# Patient Record
Sex: Male | Born: 1985
Health system: Southern US, Community
[De-identification: ages and names within clinical notes are randomized; demographics above are authoritative.]

## PROBLEM LIST (undated history)

## (undated) DIAGNOSIS — R51 Headache: Secondary | ICD-10-CM

## (undated) DIAGNOSIS — M8700 Idiopathic aseptic necrosis of unspecified bone: Secondary | ICD-10-CM

## (undated) DIAGNOSIS — R519 Headache, unspecified: Secondary | ICD-10-CM

## (undated) HISTORY — PX: TYMPANOSTOMY TUBE PLACEMENT: SHX32

## (undated) HISTORY — PX: WISDOM TOOTH EXTRACTION: SHX21

---

## 2015-06-05 ENCOUNTER — Ambulatory Visit (HOSPITAL_BASED_OUTPATIENT_CLINIC_OR_DEPARTMENT_OTHER)
Admission: RE | Admit: 2015-06-05 | Discharge: 2015-06-05 | Disposition: A | Discharge status: Home / Self Care | Payer: BLUE CROSS/BLUE SHIELD | Source: Ambulatory Visit | Attending: Medical | Admitting: Medical

## 2015-06-05 ENCOUNTER — Ambulatory Visit (INDEPENDENT_AMBULATORY_CARE_PROVIDER_SITE_OTHER): Payer: BLUE CROSS/BLUE SHIELD | Admitting: Medical

## 2015-06-05 ENCOUNTER — Encounter: Payer: Self-pay | Admitting: Medical

## 2015-06-05 VITALS — BP 126/80 | HR 68 | Temp 98.1°F | Ht 72.0 in | Wt 213.4 lb

## 2015-06-05 DIAGNOSIS — R059 Cough, unspecified: Secondary | ICD-10-CM

## 2015-06-05 DIAGNOSIS — R05 Cough: Secondary | ICD-10-CM | POA: Diagnosis not present

## 2015-06-05 DIAGNOSIS — F17200 Nicotine dependence, unspecified, uncomplicated: Secondary | ICD-10-CM | POA: Insufficient documentation

## 2015-06-05 DIAGNOSIS — M549 Dorsalgia, unspecified: Secondary | ICD-10-CM | POA: Diagnosis not present

## 2015-06-05 DIAGNOSIS — Z720 Tobacco use: Secondary | ICD-10-CM

## 2015-06-05 DIAGNOSIS — Z87891 Personal history of nicotine dependence: Secondary | ICD-10-CM

## 2015-06-05 DIAGNOSIS — L089 Local infection of the skin and subcutaneous tissue, unspecified: Secondary | ICD-10-CM

## 2015-06-05 MED ORDER — DOXYCYCLINE HYCLATE 100 MG PO TABS: 100.0000 mg | ORAL_TABLET | Freq: Two times a day (BID) | ORAL | Status: DC

## 2015-06-05 NOTE — Progress Notes (Signed)
   Subjective:    Patient ID: Devin Romero, male    DOB: 1986-01-17, 30 y.o.   MRN: [id number]  HPI  I have reviewed pt PMH, PSH, FH, Social History and Surgical History  Pt works Advertising account planner, No exercise,  2-3 cups coffee a day, pt eats healthy, married- 30 years old.  Pt states he has some mild annoyance in his back. He points to his left upper back region/thoracic for 4 months. No injury. Pain not worse with movement. No known insect bites. No fever or chills.  Pt is a smoker. He plans to stop but want to try to quit on his own. If fails then willing to try medications.     Review of Systems  Constitutional: Negative for fever, chills and fatigue.  HENT: Negative for congestion, ear pain, mouth sores, postnasal drip, rhinorrhea and sinus pressure.   Respiratory: Positive for cough. Negative for shortness of breath and wheezing.        Rare occasional mild cough  Cardiovascular: Negative for chest pain and palpitations.  Gastrointestinal: Negative for abdominal pain.  Musculoskeletal: Positive for back pain.  Skin: Positive for rash.       See hpi and physical exam  Neurological: Negative for dizziness, seizures, facial asymmetry and weakness.  Hematological: Negative for adenopathy. Does not bruise/bleed easily.  Psychiatric/Behavioral: Negative for behavioral problems and confusion.    History reviewed. No pertinent past medical history.  Social History   Social History  . Marital Status: Married    Spouse Name: N/A  . Number of Children: N/A  . Years of Education: N/A   Occupational History  . Not on file.   Social History Main Topics  . Smoking status: Current Every Day Smoker  . Smokeless tobacco: Never Used  . Alcohol Use: No     Comment: 1-2 beers occasionally.   . Drug Use: No  . Sexual Activity: Not on file   Other Topics Concern  . Not on file   Social History Narrative  . No narrative on file    History reviewed. No pertinent past surgical  history.  Family History  Problem Relation Age of Onset  . Hyperlipidemia Mother   . Hypertension Maternal Grandmother     No Known Allergies  No current outpatient prescriptions on file prior to visit.   No current facility-administered medications on file prior to visit.    BP 126/80 mmHg  Pulse 68  Temp(Src) 98.1 F (36.7 C) (Oral)  Ht 6' (1.829 m)  Wt 213 lb 6.4 oz (96.798 kg)  BMI 28.94 kg/m2  SpO2 98%       Objective:   Physical Exam   General- No acute distress. Pleasant patient. Neck- Full range of motion, no jvd Lungs- Clear, even and unlabored. Heart- regular rate and rhythm. Neurologic- CNII- XII grossly intact.  Back- left side of back. Upper thorax. Adjacent to spine. Mild pink/red area about 2.5 cm x 2.0cm. Faint warm. Faint tender. No vesicles.      Assessment & Plan:  By physical exam this rash likely represents skin infection. Will prescribe doxycycline antibiotic.  This has been present for 4 weeks and want to follow closely to assess response to antibiotic. If you notice any blister type outbreak then let us know and would rx antiviral.  Pt has history of smoking and  cough occasionally will get xray of chest.   Follow up 10-14 days or as needed.  You could schedule CPE also as well.

## 2015-06-05 NOTE — Patient Instructions (Addendum)
By physical exam this rash likely represents skin infection. Will prescribe doxycycline antibiotic.  This has been present for 4 weeks and want to follow closely to assess response to antibiotic. If you notice any blister type outbreak then let us know and would rx antiviral.  Pt has history of smoking and  cough occasionally will get xray of chest.   Follow up 10-14 days or as needed.  You could schedule CPE also as well.

## 2015-06-05 NOTE — Progress Notes (Signed)
Pre visit review using our clinic review tool, if applicable. No additional management support is needed unless otherwise documented below in the visit note. 

## 2015-08-21 ENCOUNTER — Emergency Department (HOSPITAL_BASED_OUTPATIENT_CLINIC_OR_DEPARTMENT_OTHER)
Admission: EM | Admit: 2015-08-21 | Discharge: 2015-08-21 | Disposition: A | Discharge status: Home / Self Care | Payer: BLUE CROSS/BLUE SHIELD | Attending: Emergency Medicine | Admitting: Emergency Medicine

## 2015-08-21 ENCOUNTER — Emergency Department (HOSPITAL_BASED_OUTPATIENT_CLINIC_OR_DEPARTMENT_OTHER): Payer: BLUE CROSS/BLUE SHIELD

## 2015-08-21 ENCOUNTER — Encounter (HOSPITAL_BASED_OUTPATIENT_CLINIC_OR_DEPARTMENT_OTHER): Payer: Self-pay | Admitting: Emergency Medicine

## 2015-08-21 DIAGNOSIS — Y939 Activity, unspecified: Secondary | ICD-10-CM | POA: Diagnosis not present

## 2015-08-21 DIAGNOSIS — Y999 Unspecified external cause status: Secondary | ICD-10-CM | POA: Insufficient documentation

## 2015-08-21 DIAGNOSIS — Y9289 Other specified places as the place of occurrence of the external cause: Secondary | ICD-10-CM | POA: Insufficient documentation

## 2015-08-21 DIAGNOSIS — F17200 Nicotine dependence, unspecified, uncomplicated: Secondary | ICD-10-CM | POA: Diagnosis not present

## 2015-08-21 DIAGNOSIS — S8992XA Unspecified injury of left lower leg, initial encounter: Secondary | ICD-10-CM | POA: Diagnosis present

## 2015-08-21 DIAGNOSIS — S8002XA Contusion of left knee, initial encounter: Secondary | ICD-10-CM | POA: Insufficient documentation

## 2015-08-21 MED ORDER — NAPROXEN 500 MG PO TABS: 500.0000 mg | ORAL_TABLET | Freq: Two times a day (BID) | ORAL | Status: DC

## 2015-08-21 MED ORDER — KETOROLAC TROMETHAMINE 30 MG/ML IJ SOLN
30.0000 mg | Freq: Once | INTRAMUSCULAR | Status: AC
  Administered 2015-08-21: 30 mg via INTRAMUSCULAR
  Filled 2015-08-21: qty 1

## 2015-08-21 NOTE — ED Notes (Signed)
Crutch training completed, left knee sleeve applied
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
## 2015-08-21 NOTE — ED Notes (Signed)
Ice packs applied to lateral and medial aspect of knee
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
## 2015-08-21 NOTE — Discharge Instructions (Signed)
Your knee x-ray showed a little bit of swelling but no fracture or dislocation. Your chest x-ray was normal. We gave you a knee sleeve and crutches to help with your symptoms. Take naproxen as needed for pain. Keep your leg elevated and ice on and off for the next 48 hours to help with pain/swelling. Call Dr. Lazaro ArmsHudnall's office to schedule orthopedic follow up if your symptoms persist. Return to the ER for new or worsening symptoms.

## 2016-09-16 ENCOUNTER — Encounter: Payer: Self-pay | Admitting: Medical

## 2016-09-16 ENCOUNTER — Telehealth: Payer: Self-pay | Admitting: Medical

## 2016-09-16 ENCOUNTER — Ambulatory Visit (INDEPENDENT_AMBULATORY_CARE_PROVIDER_SITE_OTHER): Payer: BLUE CROSS/BLUE SHIELD | Admitting: Medical

## 2016-09-16 ENCOUNTER — Ambulatory Visit (HOSPITAL_BASED_OUTPATIENT_CLINIC_OR_DEPARTMENT_OTHER)
Admission: RE | Admit: 2016-09-16 | Discharge: 2016-09-16 | Disposition: A | Discharge status: Home / Self Care | Payer: BLUE CROSS/BLUE SHIELD | Source: Ambulatory Visit | Attending: Medical | Admitting: Medical

## 2016-09-16 VITALS — BP 120/58 | HR 77 | Temp 98.5°F | Ht 72.0 in | Wt 218.4 lb

## 2016-09-16 DIAGNOSIS — I96 Gangrene, not elsewhere classified: Secondary | ICD-10-CM | POA: Insufficient documentation

## 2016-09-16 DIAGNOSIS — M25551 Pain in right hip: Secondary | ICD-10-CM

## 2016-09-16 DIAGNOSIS — M87351 Other secondary osteonecrosis, right femur: Secondary | ICD-10-CM | POA: Diagnosis not present

## 2016-09-16 DIAGNOSIS — R1031 Right lower quadrant pain: Secondary | ICD-10-CM | POA: Diagnosis not present

## 2016-09-16 DIAGNOSIS — T7840XA Allergy, unspecified, initial encounter: Secondary | ICD-10-CM | POA: Diagnosis not present

## 2016-09-16 DIAGNOSIS — M87051 Idiopathic aseptic necrosis of right femur: Secondary | ICD-10-CM

## 2016-09-16 MED ORDER — PREDNISONE 10 MG PO TABS: ORAL_TABLET | ORAL | 0 refills | Status: DC

## 2016-09-16 MED ORDER — MELOXICAM 15 MG PO TABS: 15.0000 mg | ORAL_TABLET | Freq: Every day | ORAL | 0 refills | Status: DC

## 2016-09-16 MED ORDER — TRAMADOL HCL 50 MG PO TABS: 50.0000 mg | ORAL_TABLET | Freq: Three times a day (TID) | ORAL | 0 refills | Status: DC | PRN

## 2016-09-16 MED ORDER — HYDROXYZINE HCL 25 MG PO TABS: 25.0000 mg | ORAL_TABLET | Freq: Three times a day (TID) | ORAL | 0 refills | Status: DC | PRN

## 2016-09-16 NOTE — Telephone Encounter (Signed)
referal to orthopedist placed. Can you get him in next week.

## 2016-09-16 NOTE — Progress Notes (Signed)
Patient in for pain in Right upper thigh and rash on both left and right lower legs with itching..Marland Kitchen

## 2016-09-16 NOTE — Progress Notes (Signed)
Subjective:    Patient ID: Devin Romero, male    DOB: October 24, 1985, 31 y.o.   MRN: [id number]  HPI  Pt in with lower ext area that were itching with faint small area that itched in pretibial area. Rash for  2-3 weeks ago which came up after hiking. He scratched aggressivley and broke skin. Area still itch some. No yellow discharge from any broken skin area. Pt has not tied anything.  Pt also states some rt hip area pain with some pain in rt groin area. Pt was on vacation when felt pain. He did not do any sports but walked alot. He first noticed this early in am about 10 days ago.    Review of Systems  Constitutional: Negative for chills, fatigue and fever.  Respiratory: Negative for cough, chest tightness, shortness of breath and wheezing.   Cardiovascular: Negative for chest pain and palpitations.  Gastrointestinal: Negative for abdominal distention, anal bleeding, blood in stool, constipation, diarrhea, nausea and vomiting.  Musculoskeletal: Negative for back pain, joint swelling and myalgias.       Rt hip and groin region pain.  Skin: Negative for rash.       Lower ext- scattered small breakdown of skin. Appears  prior scattered and ruptured vesicles from itching.  Neurological: Negative for dizziness, weakness, numbness and headaches.  Hematological: Negative for adenopathy. Does not bruise/bleed easily.  Psychiatric/Behavioral: Negative for behavioral problems and confusion. The patient is not nervous/anxious.     No past medical history on file.   Social History   Social History  . Marital status: Married    Spouse name: N/A  . Number of children: N/A  . Years of education: N/A   Occupational History  . Not on file.   Social History Main Topics  . Smoking status: Current Every Day Smoker    Packs/day: 0.50  . Smokeless tobacco: Never Used  . Alcohol use 0.0 oz/week     Comment: 1-2 beers occasionally.   . Drug use: No  . Sexual activity: Yes   Other Topics  Concern  . Not on file   Social History Narrative  . No narrative on file    No past surgical history on file.  Family History  Problem Relation Age of Onset  . Hyperlipidemia Mother   . Hypertension Maternal Grandmother     No Known Allergies  Current Outpatient Prescriptions on File Prior to Visit  Medication Sig Dispense Refill  . naproxen (NAPROSYN) 500 MG tablet Take 1 tablet (500 mg total) by mouth 2 (two) times daily. (Patient not taking: Reported on 09/16/2016) 30 tablet 0   No current facility-administered medications on file prior to visit.     BP (!) 120/58   Pulse 77   Temp 98.5 F (36.9 C) (Oral)   Ht 6' (1.829 m)   Wt 218 lb 6.4 oz (99.1 kg)   SpO2 100%   BMI 29.62 kg/m       Objective:   Physical Exam  General- No acute distress. Pleasant patient. Neck- Full range of motion, no jvd Lungs- Clear, even and unlabored. Heart- regular rate and rhythm. Neurologic- CNII- XII grossly intact.  Rt hip- no pain on palpation directly but on range of motion pain. Leg roll causes pain in groin.  Genital- no hernia on exam of inguinal canal either side.  Abdomen- soft, non-tender, non-distended, +bs, no rebound or guarding. No rt lower quadrant pain. No heel jar pain.  Skin- lower ext- scattered small region  with appearance of possible prior  scattered broken vesicles that have been scratch/ruptured. 10-12 on each pretibial area. Also linear scratch lt pretibial area. No warmth or dc. No redness.         Assessment & Plan:  For your rt hip and groin area pain will get xray of your hip.  Will rx prednisone taper prescription for pain/infalmmation. Will give tx tramadol for moderate to severe pain. But low number of tramadol tabs.  If your pain persists past Wednesday of next week then refer to sports medicine.  For allergic reaction on lower ext secondary to likely exposure while hiking will rx hydroxyzine for itching. The taper prednisone should help  with allergic reaction as well.  Follow up 5-6 days or as needed  Eulis Salazar, Ramon DredgeEdward, VF CorporationPA-C

## 2016-09-16 NOTE — Patient Instructions (Addendum)
For your rt hip and groin area pain will get xray of your hip.  Will rx prednisone taper prescription for pain/inflammation. Will give tx tramadol for moderate to severe pain. But low number of tramadol tabs.  If your pain persists past Wednesday of next week then refer to sports medicine.  For allergic reaction on lower ext secondary to likely exposure while hiking will rx hydroxyzine for itching. The taper prednisone should help with allergic reaction as well.  Follow up 5-6 days or as needed

## 2016-09-17 ENCOUNTER — Telehealth (INDEPENDENT_AMBULATORY_CARE_PROVIDER_SITE_OTHER): Payer: Self-pay

## 2016-09-17 ENCOUNTER — Encounter: Payer: Self-pay | Admitting: Medical

## 2016-09-17 NOTE — Telephone Encounter (Signed)
[facility] [place] called about a stat referral from [facility] [employer], [place]. Pt was placed on hold  Then hung up per [place]. PW said if pt calls back he needs to ask PCP for an MRI. PW called Esperanza RichtersEdward Saguier 2 x then left a message with the front desk.

## 2016-09-17 NOTE — Telephone Encounter (Signed)
[facility], awaiting appt

## 2016-09-22 ENCOUNTER — Ambulatory Visit (INDEPENDENT_AMBULATORY_CARE_PROVIDER_SITE_OTHER): Payer: BLUE CROSS/BLUE SHIELD | Admitting: Physician Assistant

## 2016-09-22 DIAGNOSIS — M25551 Pain in right hip: Secondary | ICD-10-CM | POA: Diagnosis not present
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
## 2016-09-24 ENCOUNTER — Other Ambulatory Visit (INDEPENDENT_AMBULATORY_CARE_PROVIDER_SITE_OTHER): Payer: Self-pay

## 2016-09-24 DIAGNOSIS — M25551 Pain in right hip: Secondary | ICD-10-CM

## 2016-09-30 ENCOUNTER — Encounter (INDEPENDENT_AMBULATORY_CARE_PROVIDER_SITE_OTHER): Payer: Self-pay | Admitting: Physician Assistant

## 2016-10-10 ENCOUNTER — Ambulatory Visit
Admission: RE | Admit: 2016-10-10 | Discharge: 2016-10-10 | Disposition: A | Discharge status: Home / Self Care | Payer: BLUE CROSS/BLUE SHIELD | Source: Ambulatory Visit | Attending: Physician Assistant | Admitting: Physician Assistant

## 2016-10-10 DIAGNOSIS — M25551 Pain in right hip: Secondary | ICD-10-CM | POA: Diagnosis not present

## 2016-10-11 ENCOUNTER — Telehealth: Payer: Self-pay | Admitting: Medical

## 2016-10-13 MED ORDER — TRAMADOL HCL 50 MG PO TABS: 50.0000 mg | ORAL_TABLET | Freq: Three times a day (TID) | ORAL | 0 refills | Status: DC | PRN

## 2016-10-13 NOTE — Telephone Encounter (Signed)
Pt is requesting refill on tramadol 50mg . [name] Pt.   Last OV: 09/16/2016 Last Fill: 09/16/2016 #6 and 0RF  Please advise.

## 2016-10-13 NOTE — Telephone Encounter (Signed)
Devin Romero gave him 6 tramadol in May  NCCSR: - this is the only entry.  Will refill 10 pills

## 2016-10-15 ENCOUNTER — Telehealth: Payer: Self-pay | Admitting: Medical

## 2016-10-15 NOTE — Telephone Encounter (Signed)
Caller name: Devin Romero Relationship to patient: self Can be reached: [phone]  Reason for call: Pt is asking for [name] to review his recent [facility]. He would like [name]'s opinion. He would also like referral to another ortho for 2nd opinion. Please call pt when back in office.

## 2016-10-17 ENCOUNTER — Other Ambulatory Visit: Payer: Self-pay | Admitting: Family Medicine

## 2016-10-17 ENCOUNTER — Other Ambulatory Visit: Payer: Self-pay | Admitting: Medical

## 2016-10-18 ENCOUNTER — Telehealth: Payer: Self-pay | Admitting: Medical

## 2016-10-18 DIAGNOSIS — M6281 Muscle weakness (generalized): Secondary | ICD-10-CM | POA: Diagnosis not present

## 2016-10-18 DIAGNOSIS — M87051 Idiopathic aseptic necrosis of right femur: Secondary | ICD-10-CM

## 2016-10-18 DIAGNOSIS — M25551 Pain in right hip: Secondary | ICD-10-CM | POA: Diagnosis not present

## 2016-10-18 NOTE — Telephone Encounter (Signed)
Second opinion ortho referral made for patient as well.

## 2016-10-18 NOTE — Telephone Encounter (Signed)
Opened to review 

## 2016-10-18 NOTE — Telephone Encounter (Signed)
Pt is out of 10 tablets which he filled on 10-13-2016. Rt hip pain and avascular necrosis. He may get surgery in near future. I will write him rx of tramadol 30 tabs. He can pick up the rx on Wednesday. If he requires further rx past this prescription then will get him to sign pain med contract and give uds. He may get surgery in near future and not need pain med chronically. Please remind me to print the rx on Wednesday.

## 2016-10-19 NOTE — Telephone Encounter (Signed)
Left pt a message to call back. 

## 2016-10-24 DIAGNOSIS — M8700 Idiopathic aseptic necrosis of unspecified bone: Secondary | ICD-10-CM

## 2016-10-24 HISTORY — DX: Idiopathic aseptic necrosis of unspecified bone: M87.00

## 2016-10-25 ENCOUNTER — Ambulatory Visit (INDEPENDENT_AMBULATORY_CARE_PROVIDER_SITE_OTHER): Payer: BLUE CROSS/BLUE SHIELD | Admitting: Physician Assistant

## 2016-10-25 DIAGNOSIS — M87051 Idiopathic aseptic necrosis of right femur: Secondary | ICD-10-CM

## 2016-10-25 NOTE — Progress Notes (Signed)
Mr. Devin Romero returns today follow-up of his right hip status post MRI. He continues to have pain in the right hip. He is now began walking with a cane. He has cut down on his alcohol usage rarely drinking at this point in time.  Right hip: Pain with internal and external rotation. Pain within the groin with motion.   MRI right hip dated 10/11/2016: Large area of AVN involving the right hip with associated marrow edema in joint effusion. Left hip with a small focused area of AVN and femoral head. No acute fractures bony abnormalities otherwise.   Impression/plan: Spoke with the patient about the MRI findings went over the images with him at length. Due to the fact that this is greatly affecting his life and the severity of the AVN would recommend right total hip arthroplasty in the near future. He would like to proceed with this. Did discuss risk of surgery including nerve or vessel injury, DVT/PE, issues, increasing pain worsening pain and possibility of dislocation. He is given Sherri billing's card and he will call to schedule surgery.

## 2016-10-26 ENCOUNTER — Encounter: Payer: Self-pay | Admitting: Medical

## 2016-10-26 ENCOUNTER — Other Ambulatory Visit: Payer: Self-pay | Admitting: Family Medicine

## 2016-11-02 ENCOUNTER — Encounter (HOSPITAL_COMMUNITY)
Admission: RE | Admit: 2016-11-02 | Discharge: 2016-11-02 | Disposition: A | Discharge status: Home / Self Care | Payer: BLUE CROSS/BLUE SHIELD | Source: Ambulatory Visit | Attending: Orthopaedic Surgery | Admitting: Orthopaedic Surgery

## 2016-11-02 ENCOUNTER — Other Ambulatory Visit (INDEPENDENT_AMBULATORY_CARE_PROVIDER_SITE_OTHER): Payer: Self-pay | Admitting: Physician Assistant

## 2016-11-02 ENCOUNTER — Encounter (HOSPITAL_COMMUNITY): Payer: Self-pay

## 2016-11-02 DIAGNOSIS — M87051 Idiopathic aseptic necrosis of right femur: Secondary | ICD-10-CM | POA: Insufficient documentation

## 2016-11-02 DIAGNOSIS — Z01818 Encounter for other preprocedural examination: Secondary | ICD-10-CM | POA: Insufficient documentation

## 2016-11-02 HISTORY — DX: Headache, unspecified: R51.9

## 2016-11-02 HISTORY — DX: Idiopathic aseptic necrosis of unspecified bone: M87.00

## 2016-11-02 HISTORY — DX: Headache: R51

## 2016-11-02 LAB — SURGICAL PCR SCREEN
MRSA, PCR: NEGATIVE
Staphylococcus aureus: NEGATIVE

## 2016-11-02 LAB — CBC
HCT: 47.2 % (ref 39.0–52.0)
HEMOGLOBIN: 16.7 g/dL (ref 13.0–17.0)
MCH: 31 pg (ref 26.0–34.0)
MCHC: 35.4 g/dL (ref 30.0–36.0)
MCV: 87.7 fL (ref 78.0–100.0)
PLATELETS: 205 10*3/uL (ref 150–400)
RBC: 5.38 MIL/uL (ref 4.22–5.81)
RDW: 12.4 % (ref 11.5–15.5)
WBC: 7 10*3/uL (ref 4.0–10.5)

## 2016-11-02 MED ORDER — TRAMADOL HCL 50 MG PO TABS: 50.0000 mg | ORAL_TABLET | Freq: Three times a day (TID) | ORAL | 0 refills | Status: DC | PRN

## 2016-11-02 NOTE — Addendum Note (Signed)
Addended by: Orlene OchRENCE, Maeva Dant N on: 11/02/2016 09:47 AM   Modules accepted: Orders

## 2016-11-02 NOTE — Telephone Encounter (Signed)
Notified pt. Pt voiced understanding.  

## 2016-11-02 NOTE — Pre-Procedure Instructions (Signed)
Devin Romero  11/02/2016    Your procedure is scheduled on Thursday, July 19.  [facility] at 8:15 AM               Your surgery or procedure is scheduled for 10:15 PM            Call this number if you have problems the morning of surgery: [phone]               For any other questions, please call [phone], Monday - Friday 8 AM - 4 PM.    Remember:  Do not eat food or drink liquids after midnight Wednesday, July 18.  Take these medicines the morning of surgery with A SIP OF WATER :  None.   1 Week prior to surgery STOP taking Aspirin, Aspirin Products (Goody Powder, Excedrin Migraine), Ibuprofen (Advil), Naproxen (Aleve), Vitimins and Herbal Products (ie Fish Oil)  Special Instructions:  Do not smoke within 24 hours of surgery.                 [place]- Preparing For Surgery  Before surgery, you can play an important role. Because skin is not sterile, your skin needs to be as free of germs as possible. You can reduce the number of germs on your skin by washing with CHG (chlorahexidine gluconate) Soap before surgery.  CHG is an antiseptic cleaner which kills germs and bonds with the skin to continue killing germs even after washing.  Please do not use if you have an allergy to CHG or antibacterial soaps. If your skin becomes reddened/irritated stop using the CHG.  Do not shave (including legs and underarms) for at least 48 hours prior to first CHG shower. It is OK to shave your face.  Please follow these instructions carefully.   1. Shower the NIGHT BEFORE SURGERY and the MORNING OF SURGERY with CHG.   2. If you chose to wash your hair, wash your hair first as usual with your normal shampoo.  3. After you shampoo, rinse your hair and body thoroughly to remove the shampoo.  4. Use CHG as you would any other liquid soap. You can apply CHG directly to the skin and wash gently with a scrungie or a clean washcloth.   5. Apply the CHG  Soap to your body ONLY FROM THE NECK DOWN.  Do not use on open wounds or open sores. Avoid contact with your eyes, ears, mouth and genitals (private parts). Wash genitals (private parts) with your normal soap.  6. Wash thoroughly, paying special attention to the area where your surgery will be performed.  7. Thoroughly rinse your body with warm water from the neck down.  8. DO NOT shower/wash with your normal soap after using and rinsing off the CHG Soap.  9. Pat yourself dry with a CLEAN TOWEL.   10. Wear CLEAN PAJAMAS   11. Place CLEAN SHEETS on your bed the night of your first shower and DO NOT SLEEP WITH PETS.  Day of Surgery: Shower as Above Do not apply any deodorants/lotions, powders, deodorant . Please wear clean clothes to the hospital/surgery center.    Do not wear jewelry, make-up or nail polish.  Do not shave 48 hours prior to surgery.  Men may shave face and neck.  Do not bring valuables to the hospital.  [facility] is not responsible for any belongings or valuables.  Contacts, dentures or bridgework may not be worn  into surgery.  Leave your suitcase in the car.  After surgery it may be brought to your room.  For patients admitted to the hospital, discharge time will be determined by your treatment team.  Patients discharged the day of surgery will not be allowed to drive home.   Please read over the following fact sheets that you were given: Pain Booklet, Patient Instructions for Mupirocin Application, Incentive Spirometry, Surgical Site Infections.

## 2016-11-08 ENCOUNTER — Other Ambulatory Visit (INDEPENDENT_AMBULATORY_CARE_PROVIDER_SITE_OTHER): Payer: Self-pay | Admitting: Orthopaedic Surgery

## 2016-11-10 MED ORDER — TRANEXAMIC ACID 1000 MG/10ML IV SOLN
1000.0000 mg | INTRAVENOUS | Status: AC
  Administered 2016-11-11: 1000 mg via INTRAVENOUS
  Filled 2016-11-10: qty 1100

## 2016-11-11 ENCOUNTER — Inpatient Hospital Stay (HOSPITAL_COMMUNITY): Payer: BLUE CROSS/BLUE SHIELD | Admitting: Anesthesiology

## 2016-11-11 ENCOUNTER — Observation Stay (HOSPITAL_COMMUNITY)
Admission: RE | Admit: 2016-11-11 | Discharge: 2016-11-12 | Disposition: A | Discharge status: Home / Self Care | Payer: BLUE CROSS/BLUE SHIELD | Source: Ambulatory Visit | Attending: Orthopaedic Surgery | Admitting: Orthopaedic Surgery

## 2016-11-11 ENCOUNTER — Observation Stay (HOSPITAL_COMMUNITY): Payer: BLUE CROSS/BLUE SHIELD

## 2016-11-11 ENCOUNTER — Encounter (HOSPITAL_COMMUNITY): Payer: Self-pay | Admitting: *Deleted

## 2016-11-11 ENCOUNTER — Inpatient Hospital Stay (HOSPITAL_COMMUNITY): Payer: BLUE CROSS/BLUE SHIELD

## 2016-11-11 ENCOUNTER — Encounter (HOSPITAL_COMMUNITY)
Admission: RE | Disposition: A | Discharge status: Home / Self Care | Payer: Self-pay | Source: Ambulatory Visit | Attending: Orthopaedic Surgery

## 2016-11-11 DIAGNOSIS — Z471 Aftercare following joint replacement surgery: Secondary | ICD-10-CM | POA: Diagnosis not present

## 2016-11-11 DIAGNOSIS — M87051 Idiopathic aseptic necrosis of right femur: Secondary | ICD-10-CM | POA: Diagnosis not present

## 2016-11-11 DIAGNOSIS — M879 Osteonecrosis, unspecified: Principal | ICD-10-CM | POA: Insufficient documentation

## 2016-11-11 DIAGNOSIS — F17210 Nicotine dependence, cigarettes, uncomplicated: Secondary | ICD-10-CM | POA: Insufficient documentation

## 2016-11-11 DIAGNOSIS — Z79899 Other long term (current) drug therapy: Secondary | ICD-10-CM | POA: Diagnosis not present

## 2016-11-11 DIAGNOSIS — Z419 Encounter for procedure for purposes other than remedying health state, unspecified: Secondary | ICD-10-CM

## 2016-11-11 DIAGNOSIS — Z96641 Presence of right artificial hip joint: Secondary | ICD-10-CM | POA: Diagnosis not present

## 2016-11-11 HISTORY — PX: TOTAL HIP ARTHROPLASTY: SHX124

## 2016-11-11 SURGERY — ARTHROPLASTY, HIP, TOTAL, ANTERIOR APPROACH
Anesthesia: Spinal | Site: Hip | Laterality: Right

## 2016-11-11 MED ORDER — SODIUM CHLORIDE 0.9 % IV SOLN
INTRAVENOUS | Status: DC
  Administered 2016-11-11: 16:00:00 via INTRAVENOUS

## 2016-11-11 MED ORDER — DOCUSATE SODIUM 100 MG PO CAPS
100.0000 mg | ORAL_CAPSULE | Freq: Two times a day (BID) | ORAL | Status: DC
  Administered 2016-11-11 – 2016-11-12 (×2): 100 mg via ORAL
  Filled 2016-11-11 (×2): qty 1

## 2016-11-11 MED ORDER — FENTANYL CITRATE (PF) 250 MCG/5ML IJ SOLN
INTRAMUSCULAR | Status: AC
  Filled 2016-11-11: qty 5

## 2016-11-11 MED ORDER — GLYCOPYRROLATE 0.2 MG/ML IV SOSY
PREFILLED_SYRINGE | INTRAVENOUS | Status: DC | PRN
  Administered 2016-11-11 (×2): .2 mg via INTRAVENOUS

## 2016-11-11 MED ORDER — HYDROMORPHONE HCL 1 MG/ML IJ SOLN: 1.0000 mg | INTRAMUSCULAR | Status: DC | PRN

## 2016-11-11 MED ORDER — HYDROMORPHONE HCL 1 MG/ML IJ SOLN
0.2500 mg | INTRAMUSCULAR | Status: DC | PRN
  Administered 2016-11-11: 0.5 mg via INTRAVENOUS

## 2016-11-11 MED ORDER — BUPIVACAINE IN DEXTROSE 0.75-8.25 % IT SOLN
INTRATHECAL | Status: DC | PRN
  Administered 2016-11-11: 15 mg via INTRATHECAL

## 2016-11-11 MED ORDER — METOCLOPRAMIDE HCL 5 MG/ML IJ SOLN: 5.0000 mg | Freq: Three times a day (TID) | INTRAMUSCULAR | Status: DC | PRN

## 2016-11-11 MED ORDER — METHOCARBAMOL 500 MG PO TABS
ORAL_TABLET | ORAL | Status: AC
  Filled 2016-11-11: qty 1

## 2016-11-11 MED ORDER — OXYCODONE HCL 5 MG PO TABS
5.0000 mg | ORAL_TABLET | ORAL | Status: DC | PRN
  Administered 2016-11-11 (×3): 10 mg via ORAL
  Administered 2016-11-11: 5 mg via ORAL
  Administered 2016-11-12 (×5): 10 mg via ORAL
  Filled 2016-11-11 (×8): qty 2

## 2016-11-11 MED ORDER — ACETAMINOPHEN 650 MG RE SUPP: 650.0000 mg | Freq: Four times a day (QID) | RECTAL | Status: DC | PRN

## 2016-11-11 MED ORDER — ONDANSETRON HCL 4 MG/2ML IJ SOLN
INTRAMUSCULAR | Status: AC
  Filled 2016-11-11: qty 2

## 2016-11-11 MED ORDER — PROPOFOL 1000 MG/100ML IV EMUL
INTRAVENOUS | Status: AC
  Filled 2016-11-11: qty 100

## 2016-11-11 MED ORDER — MIDAZOLAM HCL 5 MG/5ML IJ SOLN
INTRAMUSCULAR | Status: DC | PRN
  Administered 2016-11-11: 2 mg via INTRAVENOUS

## 2016-11-11 MED ORDER — ALUM & MAG HYDROXIDE-SIMETH 200-200-20 MG/5ML PO SUSP: 30.0000 mL | ORAL | Status: DC | PRN

## 2016-11-11 MED ORDER — CHLORHEXIDINE GLUCONATE 4 % EX LIQD: 60.0000 mL | Freq: Once | CUTANEOUS | Status: DC

## 2016-11-11 MED ORDER — LACTATED RINGERS IV SOLN
INTRAVENOUS | Status: DC
  Administered 2016-11-11 (×2): via INTRAVENOUS

## 2016-11-11 MED ORDER — OXYCODONE HCL 5 MG PO TABS
ORAL_TABLET | ORAL | Status: AC
  Filled 2016-11-11: qty 2

## 2016-11-11 MED ORDER — SODIUM CHLORIDE 0.9 % IR SOLN
Status: DC | PRN
  Administered 2016-11-11: 3000 mL

## 2016-11-11 MED ORDER — KETOROLAC TROMETHAMINE 15 MG/ML IJ SOLN
7.5000 mg | Freq: Four times a day (QID) | INTRAMUSCULAR | Status: AC
  Administered 2016-11-11 – 2016-11-12 (×4): 7.5 mg via INTRAVENOUS
  Filled 2016-11-11 (×4): qty 1

## 2016-11-11 MED ORDER — PHENOL 1.4 % MT LIQD: 1.0000 | OROMUCOSAL | Status: DC | PRN

## 2016-11-11 MED ORDER — DEXAMETHASONE SODIUM PHOSPHATE 10 MG/ML IJ SOLN
INTRAMUSCULAR | Status: AC
  Filled 2016-11-11: qty 1

## 2016-11-11 MED ORDER — ONDANSETRON HCL 4 MG PO TABS: 4.0000 mg | ORAL_TABLET | Freq: Four times a day (QID) | ORAL | Status: DC | PRN

## 2016-11-11 MED ORDER — HYDROMORPHONE HCL 1 MG/ML IJ SOLN
INTRAMUSCULAR | Status: AC
  Filled 2016-11-11: qty 0.5

## 2016-11-11 MED ORDER — FENTANYL CITRATE (PF) 100 MCG/2ML IJ SOLN
INTRAMUSCULAR | Status: DC | PRN
  Administered 2016-11-11: 50 ug via INTRAVENOUS

## 2016-11-11 MED ORDER — ACETAMINOPHEN 325 MG PO TABS: 650.0000 mg | ORAL_TABLET | Freq: Four times a day (QID) | ORAL | Status: DC | PRN

## 2016-11-11 MED ORDER — ASPIRIN 81 MG PO CHEW
81.0000 mg | CHEWABLE_TABLET | Freq: Two times a day (BID) | ORAL | Status: DC
  Administered 2016-11-11 – 2016-11-12 (×2): 81 mg via ORAL
  Filled 2016-11-11 (×2): qty 1

## 2016-11-11 MED ORDER — MENTHOL 3 MG MT LOZG: 1.0000 | LOZENGE | OROMUCOSAL | Status: DC | PRN

## 2016-11-11 MED ORDER — DIPHENHYDRAMINE HCL 12.5 MG/5ML PO ELIX: 12.5000 mg | ORAL_SOLUTION | ORAL | Status: DC | PRN

## 2016-11-11 MED ORDER — ZOLPIDEM TARTRATE 5 MG PO TABS: 5.0000 mg | ORAL_TABLET | Freq: Every evening | ORAL | Status: DC | PRN

## 2016-11-11 MED ORDER — METHOCARBAMOL 1000 MG/10ML IJ SOLN
500.0000 mg | Freq: Four times a day (QID) | INTRAVENOUS | Status: DC | PRN
  Filled 2016-11-11: qty 5

## 2016-11-11 MED ORDER — METOCLOPRAMIDE HCL 5 MG PO TABS: 5.0000 mg | ORAL_TABLET | Freq: Three times a day (TID) | ORAL | Status: DC | PRN

## 2016-11-11 MED ORDER — METHOCARBAMOL 500 MG PO TABS
500.0000 mg | ORAL_TABLET | Freq: Four times a day (QID) | ORAL | Status: DC | PRN
  Administered 2016-11-11 – 2016-11-12 (×4): 500 mg via ORAL
  Filled 2016-11-11 (×3): qty 1

## 2016-11-11 MED ORDER — ONDANSETRON HCL 4 MG/2ML IJ SOLN
INTRAMUSCULAR | Status: DC | PRN
  Administered 2016-11-11: 4 mg via INTRAVENOUS

## 2016-11-11 MED ORDER — PROPOFOL 500 MG/50ML IV EMUL
INTRAVENOUS | Status: DC | PRN
  Administered 2016-11-11: 75 ug/kg/min via INTRAVENOUS

## 2016-11-11 MED ORDER — 0.9 % SODIUM CHLORIDE (POUR BTL) OPTIME
TOPICAL | Status: DC | PRN
  Administered 2016-11-11: 1000 mL

## 2016-11-11 MED ORDER — CEFAZOLIN SODIUM-DEXTROSE 1-4 GM/50ML-% IV SOLN
1.0000 g | Freq: Four times a day (QID) | INTRAVENOUS | Status: AC
  Administered 2016-11-11 (×2): 1 g via INTRAVENOUS
  Filled 2016-11-11 (×2): qty 50

## 2016-11-11 MED ORDER — CEFAZOLIN SODIUM-DEXTROSE 2-4 GM/100ML-% IV SOLN
2.0000 g | INTRAVENOUS | Status: AC
  Administered 2016-11-11: 2 g via INTRAVENOUS
  Filled 2016-11-11: qty 100

## 2016-11-11 MED ORDER — DEXAMETHASONE SODIUM PHOSPHATE 10 MG/ML IJ SOLN
INTRAMUSCULAR | Status: DC | PRN
  Administered 2016-11-11: 10 mg via INTRAVENOUS

## 2016-11-11 MED ORDER — PHENYLEPHRINE HCL 10 MG/ML IJ SOLN
INTRAMUSCULAR | Status: DC | PRN
  Administered 2016-11-11: 25 ug/min via INTRAVENOUS

## 2016-11-11 MED ORDER — MIDAZOLAM HCL 2 MG/2ML IJ SOLN
INTRAMUSCULAR | Status: AC
  Filled 2016-11-11: qty 2

## 2016-11-11 MED ORDER — ONDANSETRON HCL 4 MG/2ML IJ SOLN: 4.0000 mg | Freq: Four times a day (QID) | INTRAMUSCULAR | Status: DC | PRN

## 2016-11-11 SURGICAL SUPPLY — 52 items
BENZOIN TINCTURE PRP APPL 2/3 (GAUZE/BANDAGES/DRESSINGS) ×2 IMPLANT
BLADE CLIPPER SURG (BLADE) ×2 IMPLANT
BLADE SAW SGTL 18X1.27X75 (BLADE) ×2 IMPLANT
CAPT HIP TOTAL 2 ×2 IMPLANT
CELLS DAT CNTRL 66122 CELL SVR (MISCELLANEOUS) ×1 IMPLANT
COVER SURGICAL LIGHT HANDLE (MISCELLANEOUS) ×2 IMPLANT
DRAPE C-ARM 42X72 X-RAY (DRAPES) ×2 IMPLANT
DRAPE STERI IOBAN 125X83 (DRAPES) ×2 IMPLANT
DRAPE U-SHAPE 47X51 STRL (DRAPES) ×6 IMPLANT
DRSG AQUACEL AG ADV 3.5X10 (GAUZE/BANDAGES/DRESSINGS) ×2 IMPLANT
DURAPREP 26ML APPLICATOR (WOUND CARE) ×2 IMPLANT
ELECT BLADE 4.0 EZ CLEAN MEGAD (MISCELLANEOUS) ×2
ELECT BLADE 6.5 EXT (BLADE) ×2 IMPLANT
ELECT REM PT RETURN 9FT ADLT (ELECTROSURGICAL) ×2
ELECTRODE BLDE 4.0 EZ CLN MEGD (MISCELLANEOUS) ×1 IMPLANT
ELECTRODE REM PT RTRN 9FT ADLT (ELECTROSURGICAL) ×1 IMPLANT
FACESHIELD WRAPAROUND (MASK) ×6 IMPLANT
GAUZE XEROFORM 1X8 LF (GAUZE/BANDAGES/DRESSINGS) ×2 IMPLANT
GLOVE BIOGEL PI IND STRL 8 (GLOVE) ×2 IMPLANT
GLOVE BIOGEL PI INDICATOR 8 (GLOVE) ×2
GLOVE ECLIPSE 8.0 STRL XLNG CF (GLOVE) ×2 IMPLANT
GLOVE ORTHO TXT STRL SZ7.5 (GLOVE) ×4 IMPLANT
GLOVE SURG SS PI 6.5 STRL IVOR (GLOVE) ×2 IMPLANT
GOWN STRL REUS W/ TWL LRG LVL3 (GOWN DISPOSABLE) ×2 IMPLANT
GOWN STRL REUS W/ TWL XL LVL3 (GOWN DISPOSABLE) ×2 IMPLANT
GOWN STRL REUS W/TWL LRG LVL3 (GOWN DISPOSABLE) ×2
GOWN STRL REUS W/TWL XL LVL3 (GOWN DISPOSABLE) ×2
HANDPIECE INTERPULSE COAX TIP (DISPOSABLE) ×1
KIT BASIN OR (CUSTOM PROCEDURE TRAY) ×2 IMPLANT
KIT ROOM TURNOVER OR (KITS) ×2 IMPLANT
MANIFOLD NEPTUNE II (INSTRUMENTS) ×2 IMPLANT
NS IRRIG 1000ML POUR BTL (IV SOLUTION) ×2 IMPLANT
PACK TOTAL JOINT (CUSTOM PROCEDURE TRAY) ×2 IMPLANT
PAD ARMBOARD 7.5X6 YLW CONV (MISCELLANEOUS) ×2 IMPLANT
RTRCTR WOUND ALEXIS 18CM MED (MISCELLANEOUS) ×2
SET HNDPC FAN SPRY TIP SCT (DISPOSABLE) ×1 IMPLANT
STAPLER VISISTAT 35W (STAPLE) IMPLANT
STRIP CLOSURE SKIN 1/2X4 (GAUZE/BANDAGES/DRESSINGS) ×2 IMPLANT
SUT ETHIBOND NAB CT1 #1 30IN (SUTURE) ×2 IMPLANT
SUT MNCRL AB 3-0 PS2 18 (SUTURE) ×2 IMPLANT
SUT MNCRL AB 4-0 PS2 18 (SUTURE) IMPLANT
SUT VIC AB 0 CT1 27 (SUTURE) ×1
SUT VIC AB 0 CT1 27XBRD ANBCTR (SUTURE) ×1 IMPLANT
SUT VIC AB 1 CT1 27 (SUTURE) ×1
SUT VIC AB 1 CT1 27XBRD ANBCTR (SUTURE) ×1 IMPLANT
SUT VIC AB 2-0 CT1 27 (SUTURE) ×2
SUT VIC AB 2-0 CT1 TAPERPNT 27 (SUTURE) ×2 IMPLANT
TOWEL OR 17X24 6PK STRL BLUE (TOWEL DISPOSABLE) ×2 IMPLANT
TOWEL OR 17X26 10 PK STRL BLUE (TOWEL DISPOSABLE) ×2 IMPLANT
TRAY CATH 16FR W/PLASTIC CATH (SET/KITS/TRAYS/PACK) IMPLANT
TRAY FOLEY W/METER SILVER 16FR (SET/KITS/TRAYS/PACK) IMPLANT
WATER STERILE IRR 1000ML POUR (IV SOLUTION) ×4 IMPLANT

## 2016-11-11 NOTE — Anesthesia Procedure Notes (Signed)
Procedure Name: MAC Date/Time: 11/11/2016 8:38 AM Performed by: Kyung Rudd Pre-anesthesia Checklist: Patient identified, Emergency Drugs available, Suction available and Patient being monitored Patient Re-evaluated:Patient Re-evaluated prior to induction Oxygen Delivery Method: Simple face mask Induction Type: IV induction Placement Confirmation: positive ETCO2 Dental Injury: Teeth and Oropharynx as per pre-operative assessment

## 2016-11-11 NOTE — Transfer of Care (Signed)
Immediate Anesthesia Transfer of Care Note  Patient: Devin Romero  Procedure(s) Performed: Procedure(s): RIGHT TOTAL HIP ARTHROPLASTY ANTERIOR APPROACH (Right)  Patient Location: PACU  Anesthesia Type:Spinal  Level of Consciousness: awake, alert  and oriented  Airway & Oxygen Therapy: Patient Spontanous Breathing and Patient connected to face mask oxygen  Post-op Assessment: Report given to RN, Post -op Vital signs reviewed and stable and Patient moving all extremities  Post vital signs: Reviewed and stable  Last Vitals:  Vitals:   11/11/16 0655 11/11/16 1053  BP: 118/70   Pulse: (!) 53   Resp: 18   Temp: 36.7 C (P) 36.6 C    Last Pain:  Vitals:   11/11/16 0723  TempSrc:   PainSc: 2       Patients Stated Pain Goal: 2 (11/11/16 0723)  Complications: No apparent anesthesia complications

## 2016-11-11 NOTE — H&P (Signed)
TOTAL HIP ADMISSION H&P  Patient is admitted for right total hip arthroplasty.  Subjective:  Chief Complaint: right hip pain  HPI: Devin Romero, 31 y.o. male, has a history of pain and functional disability in the right hip(s) due to avascular necrosis and patient has failed non-surgical conservative treatments for greater than 12 weeks to include NSAID's and/or analgesics and activity modification.  Onset of symptoms was abrupt starting 1 years ago with rapidlly worsening course since that time.The patient noted no past surgery on the right hip(s).  Patient currently rates pain in the right hip at 10 out of 10 with activity. Patient has night pain, worsening of pain with activity and weight bearing, pain that interfers with activities of daily living and pain with passive range of motion. Patient has evidence of subchondral cysts by imaging studies. This condition presents safety issues increasing the risk of falls.  There is no current active infection.  Patient Active Problem List   Diagnosis Date Noted  . Avascular necrosis of hip, right (HCC) 11/11/2016  . Smoker 06/05/2015   Past Medical History:  Diagnosis Date  . AVN (avascular necrosis of bone) (HCC) 10/2016   Right Hip  . Headache    Migraines- as a child    Past Surgical History:  Procedure Laterality Date  . TYMPANOSTOMY TUBE PLACEMENT Left   . WISDOM TOOTH EXTRACTION      Prescriptions Prior to Admission  Medication Sig Dispense Refill Last Dose  . ibuprofen (ADVIL,MOTRIN) 200 MG tablet Take 200 mg by mouth every 8 (eight) hours as needed (for pain.).   Past Month at Unknown time  . traMADol (ULTRAM) 50 MG tablet Take 1 tablet (50 mg total) by mouth every 8 (eight) hours as needed. 30 tablet 0 11/10/2016 at Unknown time  . hydrOXYzine (ATARAX/VISTARIL) 25 MG tablet Take 1 tablet (25 mg total) by mouth every 8 (eight) hours as needed for itching. (Patient not taking: Reported on 10/25/2016) 21 tablet 0 Not Taking  .  meloxicam (MOBIC) 15 MG tablet Take 1 tablet (15 mg total) by mouth daily. (Patient not taking: Reported on 10/25/2016) 15 tablet 0 Not Taking   No Known Allergies  Social History  Substance Use Topics  . Smoking status: Current Every Day Smoker    Packs/day: 0.50    Years: 14.00  . Smokeless tobacco: Never Used  . Alcohol use 1.2 oz/week    2 Cans of beer per week    Family History  Problem Relation Age of Onset  . Hyperlipidemia Mother   . Hypertension Maternal Grandmother      Review of Systems  Musculoskeletal: Positive for joint pain.  All other systems reviewed and are negative.   Objective:  Physical Exam  Constitutional: He is oriented to person, place, and time. He appears well-developed and well-nourished.  HENT:  Head: Normocephalic and atraumatic.  Eyes: Pupils are equal, round, and reactive to light. EOM are normal.  Neck: Normal range of motion. Neck supple.  Cardiovascular: Normal rate and regular rhythm.   Respiratory: Effort normal and breath sounds normal.  GI: Soft. Bowel sounds are normal.  Musculoskeletal:       Right hip: He exhibits decreased range of motion, decreased strength, tenderness and bony tenderness.  Neurological: He is alert and oriented to person, place, and time.  Skin: Skin is warm and dry.  Psychiatric: He has a normal mood and affect.    Vital signs in last 24 hours: Temp:  [98 F (36.7 C)] 98  F (36.7 C) (07/19 0655) Pulse Rate:  [53] 53 (07/19 0655) Resp:  [18] 18 (07/19 0655) BP: (118)/(70) 118/70 (07/19 0655) SpO2:  [99 %] 99 % (07/19 0655) Weight:  [212 lb (96.2 kg)] 212 lb (96.2 kg) (07/19 0655)  Labs:   Estimated body mass index is 27.97 kg/m as calculated from the following:   Height as of 11/02/16: 6\' 1"  (1.854 m).   Weight as of this encounter: 212 lb (96.2 kg).   Imaging Review Plain radiographs and MRI demonstrate late-stage avascular necrosis of the right hip  Assessment/Plan:  Avascular necrosis, right  hip(s)  The patient history, physical examination, clinical judgement of the provider and imaging studies are consistent with end stage AVN of the right hip(s) and total hip arthroplasty is deemed medically necessary. The treatment options including medical management, injection therapy, and arthroplasty were discussed at length. The risks and benefits of total hip arthroplasty were presented and reviewed. The risks due to aseptic loosening, infection, stiffness, dislocation/subluxation,  thromboembolic complications and other imponderables were discussed.  The patient acknowledged the explanation, agreed to proceed with the plan and consent was signed. Patient is being admitted for inpatient treatment for surgery, pain control, PT, OT, prophylactic antibiotics, VTE prophylaxis, progressive ambulation and ADL's and discharge planning.The patient is planning to be discharged home with home health services

## 2016-11-11 NOTE — Brief Op Note (Signed)
11/11/2016  10:24 AM  PATIENT:  Devin Romero  31 y.o. male  PRE-OPERATIVE DIAGNOSIS:  avascular necrosis right hip  POST-OPERATIVE DIAGNOSIS:  avascular necrosis right hip  PROCEDURE:  Procedure(s): RIGHT TOTAL HIP ARTHROPLASTY ANTERIOR APPROACH (Right)  SURGEON:  Surgeon(s) and Role:    Kathryne Hitch* Manasa Spease Y, MD - Primary  PHYSICIAN ASSISTANT: Rexene EdisonGil Clark, PA-C  ANESTHESIA:   spinal  EBL:  Total I/O In: -  Out: 200 [Blood:200]  COUNTS:  YES  DICTATION: .Other Dictation: Dictation Number [id number]  PLAN OF CARE: Admit for overnight observation  PATIENT DISPOSITION:  PACU - hemodynamically stable.   Delay start of Pharmacological VTE agent (>24hrs) due to surgical blood loss or risk of bleeding: no

## 2016-11-11 NOTE — Evaluation (Signed)
Physical Therapy Evaluation Patient Details Name: Devin Romero MRN: [id number] DOB: 1985-08-27 Today's Date: 11/11/2016   History of Present Illness  Pt is a very pleasant [age] y/o male s/p R THA direct anterior approach secondary to AVN. PMH including but not limited to current smoker.  Clinical Impression  Pt presented supine in bed with HOB elevated, awake and willing to participate in therapy session. Prior to admission, pt reported that he was mod I with functional mobility with use of SPC to ambulate secondary to pain. Pt tolerated ambulating in hallway with RW and min guard for safety. Pt with difficulty voiding this session, even with a sensation of having a full bladder. Pt attempted standing at toilet in bathroom; however, still unsuccessful. PT will plan for stair training next session. PT will continue to follow acutely to ensure a safe d/c home.    Follow Up Recommendations No PT follow up;Supervision/Assistance - 24 hour;Other (comment) (initially)    Equipment Recommendations  Rolling walker with 5" wheels;3in1 (PT)    Recommendations for Other Services       Precautions / Restrictions Precautions Precautions: Fall Restrictions Weight Bearing Restrictions: Yes RLE Weight Bearing: Weight bearing as tolerated      Mobility  Bed Mobility Overal bed mobility: Needs Assistance Bed Mobility: Supine to Sit     Supine to sit: Supervision     General bed mobility comments: increased time and effort, supervision for safety  Transfers Overall transfer level: Needs assistance Equipment used: Rolling walker (2 wheeled) Transfers: Sit to/from Stand Sit to Stand: Min guard         General transfer comment: increased time and effort, cueing for hand placement initially, min guard for safety. Pt performed sit<>stand from EOB x1 and from recliner chair x1  Ambulation/Gait Ambulation/Gait assistance: Min guard Ambulation Distance (Feet): 100 Feet Assistive device:  Rolling walker (2 wheeled) Gait Pattern/deviations: Step-to pattern;Step-through pattern;Decreased step length - left;Decreased stance time - right;Decreased stride length;Decreased weight shift to right Gait velocity: decreased Gait velocity interpretation: Below normal speed for age/gender General Gait Details: mildly antalgic gait, mild instability but no overt LOB or need for physical assistance, cueing for sequencing with RW  Stairs            Wheelchair Mobility    Modified Rankin (Stroke Patients Only)       Balance Overall balance assessment: Needs assistance Sitting-balance support: Feet supported Sitting balance-Leahy Scale: Normal     Standing balance support: During functional activity Standing balance-Leahy Scale: Fair                               Pertinent Vitals/Pain Pain Assessment: 0-10 Pain Score: 3  Pain Location: R hip Pain Descriptors / Indicators: Sore (pulling) Pain Intervention(s): Monitored during session;Repositioned;Patient requesting pain meds-RN notified;RN gave pain meds during session    Home Living Family/patient expects to be discharged to:: Private residence Living Arrangements: Spouse/significant other;Children Available Help at Discharge: Family Type of Home: House Home Access: Level entry     Home Layout: Two level Home Equipment: Cane - single point;Crutches      Prior Function Level of Independence: Independent with assistive device(s)         Comments: pt reported that for the past month he had been ambulating with use of SPC secondary to pain     Hand Dominance        Extremity/Trunk Assessment   Upper Extremity Assessment Upper Extremity  Assessment: Overall WFL for tasks assessed    Lower Extremity Assessment Lower Extremity Assessment: LLE deficits/detail LLE Deficits / Details: pt with ROM limitations secondary to post-op pain LLE Sensation: decreased light touch    Cervical / Trunk  Assessment Cervical / Trunk Assessment: Other exceptions Cervical / Trunk Exceptions: pt with sensation impairments in buttock and groin area  Communication   Communication: No difficulties  Cognition Arousal/Alertness: Awake/alert Behavior During Therapy: WFL for tasks assessed/performed Overall Cognitive Status: Within Functional Limits for tasks assessed                                        General Comments      Exercises     Assessment/Plan    PT Assessment Patient needs continued PT services  PT Problem List Decreased mobility;Decreased balance;Decreased coordination;Decreased knowledge of use of DME;Decreased safety awareness;Decreased knowledge of precautions;Pain       PT Treatment Interventions DME instruction;Stair training;Functional mobility training;Gait training;Therapeutic activities;Therapeutic exercise;Balance training;Neuromuscular re-education;Patient/family education    PT Goals (Current goals can be found in the Care Plan section)  Acute Rehab PT Goals Patient Stated Goal: return home and to independence PT Goal Formulation: With patient/family Time For Goal Achievement: 11/25/16 Potential to Achieve Goals: Good    Frequency 7X/week   Barriers to discharge        Co-evaluation               AM-PAC PT "6 Clicks" Daily Activity  Outcome Measure Difficulty turning over in bed (including adjusting bedclothes, sheets and blankets)?: None Difficulty moving from lying on back to sitting on the side of the bed? : A Little Difficulty sitting down on and standing up from a chair with arms (e.g., wheelchair, bedside commode, etc,.)?: Total Help needed moving to and from a bed to chair (including a wheelchair)?: A Little Help needed walking in hospital room?: A Little Help needed climbing 3-5 steps with a railing? : A Little 6 Click Score: 17    End of Session Equipment Utilized During Treatment: Gait belt Activity Tolerance:  Patient tolerated treatment well Patient left: in chair;with call bell/phone within reach Nurse Communication: Mobility status PT Visit Diagnosis: Other abnormalities of gait and mobility (R26.89);Pain Pain - Right/Left: Right Pain - part of body: Hip    Time: 1610-9604 PT Time Calculation (min) (ACUTE ONLY): 38 min   Charges:   PT Evaluation $PT Eval Moderate Complexity: 1 Procedure PT Treatments $Gait Training: 8-22 mins $Therapeutic Activity: 8-22 mins   PT G Codes:   PT G-Codes **NOT FOR INPATIENT CLASS** Functional Assessment Tool Used: AM-PAC 6 Clicks Basic Mobility;Clinical judgement Functional Limitation: Mobility: Walking and moving around Mobility: Walking and Moving Around Current Status (V4098): At least 40 percent but less than 60 percent impaired, limited or restricted Mobility: Walking and Moving Around Goal Status [phone]): At least 1 percent but less than 20 percent impaired, limited or restricted    [facility], [place], DPT [phone]   Devin Romero 11/11/2016, 4:09 PM

## 2016-11-11 NOTE — Anesthesia Procedure Notes (Signed)
Spinal  Patient location during procedure: OR Start time: 11/11/2016 8:32 AM End time: 11/11/2016 8:36 AM Staffing Anesthesiologist: Gaynelle AduFITZGERALD, Chosen Garron Performed: anesthesiologist  Preanesthetic Checklist Completed: patient identified, surgical consent, pre-op evaluation, timeout performed, IV checked, risks and benefits discussed and monitors and equipment checked Spinal Block Patient position: sitting Prep: DuraPrep Patient monitoring: cardiac monitor, continuous pulse ox and blood pressure Approach: midline Location: L3-4 Injection technique: single-shot Needle Needle type: Pencan  Needle gauge: 24 G Needle length: 9 cm Assessment Sensory level: T8 Additional Notes Functioning IV was confirmed and monitors were applied. Sterile prep and drape, including hand hygiene and sterile gloves were used. The patient was positioned and the spine was prepped. The skin was anesthetized with lidocaine.  Free flow of clear CSF was obtained prior to injecting local anesthetic into the CSF.  The spinal needle aspirated freely following injection.  The needle was carefully withdrawn.  The patient tolerated the procedure well.

## 2016-11-11 NOTE — Anesthesia Preprocedure Evaluation (Addendum)
Anesthesia Evaluation  Patient identified by MRN, date of birth, ID band Patient awake    Reviewed: Allergy & Precautions, H&P , NPO status , Patient's Chart, lab work & pertinent test results  Airway Mallampati: I  TM Distance: >3 FB Neck ROM: Full    Dental no notable dental hx. (+) Teeth Intact, Dental Advisory Given   Pulmonary Current Smoker,    Pulmonary exam normal breath sounds clear to auscultation       Cardiovascular negative cardio ROS   Rhythm:Regular Rate:Normal     Neuro/Psych  Headaches, negative psych ROS   GI/Hepatic negative GI ROS, Neg liver ROS,   Endo/Other  negative endocrine ROS  Renal/GU negative Renal ROS  negative genitourinary   Musculoskeletal   Abdominal   Peds  Hematology negative hematology ROS (+)   Anesthesia Other Findings   Reproductive/Obstetrics negative OB ROS                            Anesthesia Physical Anesthesia Plan  ASA: II  Anesthesia Plan: Spinal   Post-op Pain Management:    Induction: Intravenous  PONV Risk Score and Plan: 1 and Ondansetron, Dexamethasone, Propofol and Midazolam  Airway Management Planned: Simple Face Mask  Additional Equipment:   Intra-op Plan:   Post-operative Plan:   Informed Consent: I have reviewed the patients History and Physical, chart, labs and discussed the procedure including the risks, benefits and alternatives for the proposed anesthesia with the patient or authorized representative who has indicated his/her understanding and acceptance.   Dental advisory given  Plan Discussed with: CRNA  Anesthesia Plan Comments:        Anesthesia Quick Evaluation

## 2016-11-11 NOTE — Anesthesia Postprocedure Evaluation (Signed)
Anesthesia Post Note  Patient: Devin Romero  Procedure(s) Performed: Procedure(s) (LRB): RIGHT TOTAL HIP ARTHROPLASTY ANTERIOR APPROACH (Right)     Patient location during evaluation: PACU Anesthesia Type: Spinal Level of consciousness: awake and alert Pain management: pain level controlled Vital Signs Assessment: post-procedure vital signs reviewed and stable Respiratory status: spontaneous breathing and respiratory function stable Cardiovascular status: blood pressure returned to baseline and stable Postop Assessment: spinal receding Anesthetic complications: no    Last Vitals:  Vitals:   11/11/16 1137 11/11/16 1152  BP: 100/63 100/61  Pulse: (!) 51 (!) 53  Resp: 15 11  Temp:      Last Pain:  Vitals:   11/11/16 1201  TempSrc:   PainSc: 7                  Graig Hessling,W. EDMOND

## 2016-11-12 ENCOUNTER — Encounter (HOSPITAL_COMMUNITY): Payer: Self-pay | Admitting: Orthopaedic Surgery

## 2016-11-12 DIAGNOSIS — M879 Osteonecrosis, unspecified: Secondary | ICD-10-CM | POA: Diagnosis not present

## 2016-11-12 LAB — CBC
HCT: 40 % (ref 39.0–52.0)
Hemoglobin: 13.6 g/dL (ref 13.0–17.0)
MCH: 29.8 pg (ref 26.0–34.0)
MCHC: 34 g/dL (ref 30.0–36.0)
MCV: 87.5 fL (ref 78.0–100.0)
PLATELETS: 169 10*3/uL (ref 150–400)
RBC: 4.57 MIL/uL (ref 4.22–5.81)
RDW: 12.3 % (ref 11.5–15.5)
WBC: 11.7 10*3/uL — ABNORMAL HIGH (ref 4.0–10.5)

## 2016-11-12 LAB — BASIC METABOLIC PANEL
Anion gap: 9 (ref 5–15)
BUN: 9 mg/dL (ref 6–20)
CO2: 26 mmol/L (ref 22–32)
Calcium: 9.2 mg/dL (ref 8.9–10.3)
Chloride: 104 mmol/L (ref 101–111)
Creatinine, Ser: 0.82 mg/dL (ref 0.61–1.24)
GFR calc Af Amer: >60 mL/min (ref >60)
GLUCOSE: 145 mg/dL — AB (ref 65–99)
POTASSIUM: 4.1 mmol/L (ref 3.5–5.1)
Sodium: 139 mmol/L (ref 135–145)

## 2016-11-12 MED ORDER — OXYCODONE-ACETAMINOPHEN 5-325 MG PO TABS: 1.0000 | ORAL_TABLET | ORAL | 0 refills | Status: DC | PRN

## 2016-11-12 MED ORDER — ASPIRIN 81 MG PO CHEW: 81.0000 mg | CHEWABLE_TABLET | Freq: Two times a day (BID) | ORAL | 0 refills | Status: DC

## 2016-11-12 MED ORDER — METHOCARBAMOL 500 MG PO TABS
500.0000 mg | ORAL_TABLET | Freq: Four times a day (QID) | ORAL | 0 refills | Status: DC | PRN
Start: 2016-11-12 — End: 2017-02-07

## 2016-11-12 NOTE — Discharge Summary (Signed)
Patient ID: Devin Romero MRN: [id number] DOB/AGE: 1985-06-04 31 y.o.  Admit date: 11/11/2016 Discharge date: 11/12/2016  Admission Diagnoses:  Principal Problem:   Avascular necrosis of hip, right [facility](HCC) Active Problems:   Status post total replacement of right hip   Discharge Diagnoses:  Same  Past Medical History:  Diagnosis Date  . AVN (avascular necrosis of bone) (HCC) 10/2016   Right Hip  . Headache    Migraines- as a child    Surgeries: Procedure(s): RIGHT TOTAL HIP ARTHROPLASTY ANTERIOR APPROACH on 11/11/2016   Consultants:   Discharged Condition: Improved  Hospital Course: Devin Romero is an 31 y.o. male who was admitted 11/11/2016 for operative treatment ofAvascular necrosis of hip, right (HCC). Patient has severe unremitting pain that affects sleep, daily activities, and work/hobbies. After pre-op clearance the patient was taken to the operating room on 11/11/2016 and underwent  Procedure(s): RIGHT TOTAL HIP ARTHROPLASTY ANTERIOR APPROACH.    Patient was given perioperative antibiotics: Anti-infectives    Start     Dose/Rate Route Frequency Ordered Stop   11/11/16 1500  ceFAZolin (ANCEF) IVPB 1 g/50 mL premix     1 g 100 mL/hr over 30 Minutes Intravenous Every 6 hours 11/11/16 1356 11/11/16 2150   11/11/16 0704  ceFAZolin (ANCEF) IVPB 2g/100 mL premix     2 g 200 mL/hr over 30 Minutes Intravenous On call to O.R. 11/11/16 [phone] 11/11/16 0840       Patient was given sequential compression devices, early ambulation, and chemoprophylaxis to prevent DVT.  Patient benefited maximally from hospital stay and there were no complications.    Recent vital signs: Patient Vitals for the past 24 hrs:  BP Temp Temp src Pulse Resp SpO2 Height Weight  11/12/16 0523 (!) 118/57 98.3 F (36.8 C) Oral (!) 51 18 99 % 6\' 1"  (1.854 m) 211 lb 10.3 oz (96 kg)  11/12/16 0016 (!) 104/51 98.4 F (36.9 C) Oral 65 18 100 % - -  11/11/16 2003 111/70 98.6 F (37 C) Oral (!) 56 18  96 % - -  11/11/16 1402 114/64 98.1 F (36.7 C) - 60 15 98 % - -  11/11/16 1350 101/68 (!) 97 F (36.1 C) - (!) 50 13 97 % - -  11/11/16 1252 100/60 - - (!) 48 11 99 % - -  11/11/16 1237 97/61 - - (!) 50 13 99 % - -  11/11/16 1222 100/64 - - (!) 55 16 99 % - -  11/11/16 1207 99/61 - - (!) 48 10 98 % - -  11/11/16 1152 100/61 - - (!) 53 11 100 % - -  11/11/16 1137 100/63 - - (!) 51 15 100 % - -  11/11/16 1122 104/65 - - (!) 59 12 98 % - -  11/11/16 1107 90/67 - - 62 13 97 % - -  11/11/16 1053 (!) 106/56 97.8 F (36.6 C) - (!) 57 11 98 % - -  11/11/16 0655 118/70 98 F (36.7 C) Oral (!) 53 18 99 % - 212 lb (96.2 kg)     Recent laboratory studies:  Recent Labs  11/12/16 0232  WBC 11.7*  HGB 13.6  HCT 40.0  PLT 169  NA 139  K 4.1  CL 104  CO2 26  BUN 9  CREATININE 0.82  GLUCOSE 145*  CALCIUM 9.2     Discharge Medications:   Allergies as of 11/12/2016   No Known Allergies     Medication List    STOP  taking these medications   ibuprofen 200 MG tablet Commonly known as:  ADVIL,MOTRIN   meloxicam 15 MG tablet Commonly known as:  MOBIC   traMADol 50 MG tablet Commonly known as:  ULTRAM     TAKE these medications   aspirin 81 MG chewable tablet Chew 1 tablet (81 mg total) by mouth 2 (two) times daily.   hydrOXYzine 25 MG tablet Commonly known as:  ATARAX/VISTARIL Take 1 tablet (25 mg total) by mouth every 8 (eight) hours as needed for itching.   methocarbamol 500 MG tablet Commonly known as:  ROBAXIN Take 1 tablet (500 mg total) by mouth every 6 (six) hours as needed for muscle spasms.   oxyCODONE-acetaminophen 5-325 MG tablet Commonly known as:  ROXICET Take 1-2 tablets by mouth every 4 (four) hours as needed.            Durable Medical Equipment        Start     Ordered   11/11/16 1357  DME Walker rolling  Once    Question:  Patient needs a walker to treat with the following condition  Answer:  Status post total replacement of right hip    11/11/16 1356      Diagnostic Studies: Dg Pelvis Portable  Result Date: 11/11/2016 CLINICAL DATA:  Status post total hip replacement on the right EXAM: PORTABLE PELVIS 1-2 VIEWS COMPARISON:  None. FINDINGS: Frontal image of lower pelvis obtained. There is a total hip replacement right with prosthetic components well-seated on frontal view. No fracture or dislocation. Left hip joint appears normal. There is soft tissue air on the right, an expected postoperative finding. IMPRESSION: Total hip replaced on the right with prosthetic components well-seated on single view. No fracture or dislocation. Left hip joint within normal limits. Electronically Signed   By: Bretta Bang III M.D.   On: 11/11/2016 11:04   Dg C-arm 1-60 Min  Result Date: 11/11/2016 CLINICAL DATA:  Right hip replacement . EXAM: DG C-ARM 61-120 MIN; OPERATIVE RIGHT HIP WITH PELVIS COMPARISON:  09/16/2016 . FINDINGS: Total right hip replacement. Hardware intact. Anatomic alignment. No acute bony abnormality . IMPRESSION: Total right hip replacement with anatomic alignment . Electronically Signed   By: Maisie Fus  Register   On: 11/11/2016 10:22   Dg Hip Operative Unilat W Or W/o Pelvis Right  Result Date: 11/11/2016 CLINICAL DATA:  Right hip replacement . EXAM: DG C-ARM 61-120 MIN; OPERATIVE RIGHT HIP WITH PELVIS COMPARISON:  09/16/2016 . FINDINGS: Total right hip replacement. Hardware intact. Anatomic alignment. No acute bony abnormality . IMPRESSION: Total right hip replacement with anatomic alignment . Electronically Signed   By: Maisie Fus  Register   On: 11/11/2016 10:22    Disposition: 01-Home or Self Care  Discharge Instructions    Discharge patient    Complete by:  As directed    Wait to discharge until after physical therapy in afternoon.   Discharge disposition:  01-Home or Self Care   Discharge patient date:  11/12/2016      Follow-up Information    Kathryne Hitch, MD Follow up in 2 week(s).   Specialty:   Orthopedic Surgery Contact information: [address] [place] [address] [phone]            Signed: Kathryne Hitch 11/12/2016, 6:52 AM

## 2016-11-12 NOTE — Op Note (Signed)
NAME:  CHOYA, TORNOW.:  MEDICAL RECORD NO.:  [id number]  LOCATION:                                 FACILITY:  PHYSICIAN:  Vanita Panda. Magnus Ivan, M.D.DATE OF BIRTH:  DATE OF PROCEDURE:  11/11/2016 DATE OF DISCHARGE:                              OPERATIVE REPORT   PREOPERATIVE DIAGNOSIS:  Severe avascular necrosis, right hip.  POSTOPERATIVE DIAGNOSIS:  Severe avascular necrosis, right hip.  PROCEDURE:  Right total hip arthroplasty through direct anterior approach.  IMPLANTS:  DePuy Sector Gription acetabular component size 54, size 36 +4 neutral polyethylene liner, size 11 Corail femoral component with standard offset, size 36 +1.5 ceramic hip ball.  SURGEON:  Vanita Panda. Magnus Ivan, M.D.  ASSISTANT:  Richardean Canal, PA-C.  ANESTHESIA:  Spinal.  ANTIBIOTICS:  2 g of IV Ancef.  BLOOD LOSS:  200 mL.  COMPLICATIONS:  None.  INDICATIONS:  Devin Romero is a very pleasant 31 year old gentleman, who has developed severe avascular necrosis involving his right hip.  There is a small nidus in his left hip femoral head, but the right hip is extensive.  Has edema all the way down to the lesser trochanter.  There is subchondral edema and evidence of close to having subcortical collapse at this point.  His pain is daily and has detrimentally affected his activities of daily living, his quality of life, and his mobility.  He has a large joint effusion as well on an MRI.  At this point, other than nonoperative treatment a total hip arthroplasty was discussed with him.  We had a long and thorough discussion about the risks and benefits of the surgery.  The risks included acute blood loss anemia, nerve and vessel injury, fracture, infection, dislocation, DVT as well as failure of the implants with time given his young age.  He understands our goals are to decrease pain, improve mobility, and overall improved quality of life.  PROCEDURE DESCRIPTION:  After  informed consent was obtained, appropriate right hip was marked.  He was brought to the operating room where spinal anesthesia was obtained while he was on the stretcher.  Traction boots were placed on both his feet.  Next, he was placed supine on the Hana fracture table with the perineal post in place and both legs in inline skeletal traction devices, but no traction applied.  His right operative hip was then prepped and draped with DuraPrep and sterile drapes.  Time- out was called to identify correct patient and correct right hip.  I then made an incision just inferior and posterior to the anterior superior iliac spine and carried this obliquely down the leg.  We dissected down tensor fascia lata muscle.  The tensor fascia was then divided longitudinally to proceed with a direct anterior approach to the hip.  We identified and cauterized the circumflex vessels and identified the hip capsule.  I opened the hip capsule in an L-type format, finding a very large joint effusion.  We placed Cobra retractors around the lateral medial femoral neck and made our femoral neck cut with an oscillating saw proximal to the lesser trochanter and completed this with an osteotome.  I placed a corkscrew guide in  the femoral head and removed the femoral head in its entirety and found a large section of cartilage flaking right off.  This was consistent with end-stage avascular necrosis.  We then placed a bent Hohmann over the medial acetabular rim and removed remnants of acetabular labrum.  We then began reaming under direct visualization from a size 42 reamer, we went up stepwise increments up to a size 54 with all reamers under direct visualization and the last reamer under direct fluoroscopy, so we could obtain our depth of reaming, our inclination, and anteversion.  Once I was pleased with this, I placed the real DePuy Sector Gription acetabular component size 54 and a 36 +4 polyethylene liner for  that size acetabular component.  Attention was then turned to the femur. With the leg externally rotated to 130 degrees, extended, and adducted, we were to place a Mueller retractor medially and Hohmann retractor behind the greater trochanter.  I released the lateral joint capsule and used a box cutting osteotome in the inner femoral canal and a rongeur to lateralize.  We then began broaching using the Corail broaching system from a size 8 going up to a size 11.  With the size 11 in place, we trialed a standard offset femoral neck and a 36 +1.5 hip ball.  We brought the leg back over and up with traction and internal rotation reducing in the pelvis.  We were pleased with leg length, offset, stability, and range of motion.  We then dislocated the hip and removed the trial components.  We were able to place the real Corail femoral component with standard offset and the real 36 +1.5 ceramic hip ball and again reduced this in the acetabulum.  We were pleased with leg length, offset, and stability.  We then irrigated the soft tissue with normal saline solution using pulsatile lavage.  We closed the joint capsule with interrupted #1 Ethibond suture followed by running #1 Vicryl in the tensor fascia, 0 Vicryl in the deep tissue, 2-0 Vicryl in the subcutaneous tissue, 4-0 Monocryl subcuticular stitch, and Steri-Strips on the skin.  An Aquacel dressing was applied.  [facility].  In-and-out catheterization was performed of his bladder. He was taken to the recovery room in stable condition.  All final counts were correct.  There were no complications noted.  Of note, Richardean CanalGilbert Clark, PA-C, assisted in entire case.  His assistance was crucial for facilitating all aspects of this case.     Vanita Pandahristopher Y. Magnus IvanBlackman, M.D.     CYB/MEDQ  D:  11/11/2016  T:  11/11/2016  Job:  [id number]

## 2016-11-12 NOTE — Progress Notes (Signed)
Physical Therapy Treatment Patient Details Name: Devin Romero MRN: [id number] DOB: Oct 06, 1985 Today's Date: 11/12/2016    History of Present Illness Pt is a very pleasant 31 y/o male s/p R THA direct anterior approach secondary to AVN. PMH including but not limited to current smoker.    PT Comments    Today's skilled session focused on educating pt safe technique to negotiate steps and uses for 3in1. Pt is progressing well towards goals and expected to d/c this PM. Plan to focus on HEP for PM session as pt will not have PT once d/c.  Follow Up Recommendations  No PT follow up;Supervision/Assistance - 24 hour;Other (comment) (initially)     Equipment Recommendations  Rolling walker with 5" wheels;3in1 (PT)    Recommendations for Other Services       Precautions / Restrictions Precautions Precautions: Fall Restrictions Weight Bearing Restrictions: Yes RLE Weight Bearing: Weight bearing as tolerated    Mobility  Bed Mobility Overal bed mobility: Modified Independent Bed Mobility: Supine to Sit     Supine to sit: Modified independent (Device/Increase time)     General bed mobility comments: increased time and effort  Transfers Overall transfer level: Needs assistance Equipment used: Rolling walker (2 wheeled) Transfers: Sit to/from Stand Sit to Stand: Supervision         General transfer comment: No physical assist or cueing required. Supervision for safety  Ambulation/Gait Ambulation/Gait assistance: Supervision Ambulation Distance (Feet): 240 Feet Assistive device: Rolling walker (2 wheeled) Gait Pattern/deviations: Step-to pattern;Step-through pattern;Decreased step length - left;Decreased stance time - right;Decreased stride length;Decreased weight shift to right Gait velocity: decreased Gait velocity interpretation: Below normal speed for age/gender General Gait Details: mildly antalgic gait, Cueing for knee flexion on R for fluid gait  pattern.   Stairs Stairs: Yes   Stair Management: One rail Left;Step to pattern;Forwards Number of Stairs: 16 General stair comments: pt negotiated 1 flight of stairs with cueing for technique. Min guard for safety.   Wheelchair Mobility    Modified Rankin (Stroke Patients Only)       Balance Overall balance assessment: Needs assistance Sitting-balance support: Feet supported Sitting balance-Leahy Scale: Normal     Standing balance support: During functional activity Standing balance-Leahy Scale: Fair                              Cognition Arousal/Alertness: Awake/alert Behavior During Therapy: WFL for tasks assessed/performed Overall Cognitive Status: Within Functional Limits for tasks assessed                                        Exercises Total Joint Exercises Long Arc Quad: AROM;Right;10 reps;Seated    General Comments General comments (skin integrity, edema, etc.): Educated pt on 3in1 and answered pt questions reguarding equiptment.       Pertinent Vitals/Pain Pain Assessment: No/denies pain Pain Location: R hip    Home Living                      Prior Function            PT Goals (current goals can now be found in the care plan section) Acute Rehab PT Goals Patient Stated Goal: return home and to independence PT Goal Formulation: With patient/family Time For Goal Achievement: 11/25/16 Potential to Achieve Goals: Good Progress towards PT goals: Progressing toward  goals    Frequency    7X/week      PT Plan Current plan remains appropriate    Co-evaluation              AM-PAC PT "6 Clicks" Daily Activity  Outcome Measure  Difficulty turning over in bed (including adjusting bedclothes, sheets and blankets)?: None Difficulty moving from lying on back to sitting on the side of the bed? : A Little Difficulty sitting down on and standing up from a chair with arms (e.g., wheelchair, bedside  commode, etc,.)?: A Little Help needed moving to and from a bed to chair (including a wheelchair)?: A Little Help needed walking in hospital room?: A Little Help needed climbing 3-5 steps with a railing? : A Little 6 Click Score: 19    End of Session Equipment Utilized During Treatment: Gait belt Activity Tolerance: Patient tolerated treatment well Patient left: in chair;with call bell/phone within reach Nurse Communication: Mobility status PT Visit Diagnosis: Other abnormalities of gait and mobility (R26.89);Pain Pain - Right/Left: Right Pain - part of body: Hip     Time: 1610-9604 PT Time Calculation (min) (ACUTE ONLY): 35 min  Charges:  $Gait Training: 8-22 mins $Therapeutic Activity: 8-22 mins                    G Codes:       Kallie Locks, [place] Pager [phone] Acute Rehab   Sheral Apley 11/12/2016, 10:24 AM

## 2016-11-12 NOTE — Discharge Instructions (Signed)

## 2016-11-12 NOTE — Progress Notes (Signed)
Subjective: 1 Day Post-Op Procedure(s) (LRB): RIGHT TOTAL HIP ARTHROPLASTY ANTERIOR APPROACH (Right) Patient reports pain as moderate.  Vitals and labs stable.  Objective: Vital signs in last 24 hours: Temp:  [97 F (36.1 C)-98.6 F (37 C)] 98.3 F (36.8 C) (07/20 0523) Pulse Rate:  [48-65] 51 (07/20 0523) Resp:  [10-18] 18 (07/20 0523) BP: (90-118)/(51-70) 118/57 (07/20 0523) SpO2:  [96 %-100 %] 99 % (07/20 0523) Weight:  [211 lb 10.3 oz (96 kg)-212 lb (96.2 kg)] 211 lb 10.3 oz (96 kg) (07/20 0523)  Intake/Output from previous day: 07/19 0701 - 07/20 0700 In: 2070 [P.O.:480; I.V.:1540; IV Piggyback:50] Out: 1300 [Urine:1100; Blood:200] Intake/Output this shift: No intake/output data recorded.   Recent Labs  11/12/16 0232  HGB 13.6    Recent Labs  11/12/16 0232  WBC 11.7*  RBC 4.57  HCT 40.0  PLT 169    Recent Labs  11/12/16 0232  NA 139  K 4.1  CL 104  CO2 26  BUN 9  CREATININE 0.82  GLUCOSE 145*  CALCIUM 9.2   No results for input(s): LABPT, INR in the last 72 hours.  Sensation intact distally Intact pulses distally Dorsiflexion/Plantar flexion intact Incision: scant drainage  Assessment/Plan: 1 Day Post-Op Procedure(s) (LRB): RIGHT TOTAL HIP ARTHROPLASTY ANTERIOR APPROACH (Right) Up with therapy Discharge home with home health this afternoon.  Kathryne HitchChristopher Y Nahiara Kretzschmar 11/12/2016, 6:49 AM

## 2016-11-12 NOTE — Progress Notes (Signed)
Pt ready for d/c home today after PT per MD. Pt met PT goals. Equipment will be delivered to pt's room prior to d/c. Discharge instructions and prescriptions reviewed with pt and wife, all questions answered.   [place], Jerry Caras

## 2016-11-12 NOTE — Progress Notes (Signed)
Physical Therapy Treatment Patient Details Name: Devin Romero MRN: [id number] DOB: 07/15/1985 Today's Date: 11/12/2016    History of Present Illness Pt is a very pleasant 31 y/o male s/p R THA direct anterior approach secondary to AVN. PMH including but not limited to current smoker.    PT Comments    This afternoon's skilled session focused on educating pt on HEP to aid in recovery, as he is returning home without PT. Pt is progressing well towards all goals and expected to discharge this PM.   Follow Up Recommendations  No PT follow up;Supervision/Assistance - 24 hour;Other (comment) (initially)     Equipment Recommendations  Rolling walker with 5" wheels;3in1 (PT)    Recommendations for Other Services       Precautions / Restrictions Precautions Precautions: Fall Restrictions Weight Bearing Restrictions: Yes RLE Weight Bearing: Weight bearing as tolerated    Mobility  Bed Mobility               General bed mobility comments: Standing in room on arrival  Transfers Overall transfer level: Needs assistance Equipment used: Rolling walker (2 wheeled) Transfers: Sit to/from Stand Sit to Stand: Modified independent (Device/Increase time)         General transfer comment: Increased time and effort. No physical assist or cueing required.  Ambulation/Gait Ambulation/Gait assistance: Supervision Ambulation Distance (Feet): 280 Feet Assistive device: Rolling walker (2 wheeled) Gait Pattern/deviations: Step-through pattern;Decreased weight shift to right;Antalgic Gait velocity: decreased Gait velocity interpretation: Below normal speed for age/gender General Gait Details: mildly antalgic gait. Improved gait pattern and knee flexion since previous session.   Stairs            Wheelchair Mobility    Modified Rankin (Stroke Patients Only)       Balance Overall balance assessment: Needs assistance Sitting-balance support: Feet supported Sitting  balance-Leahy Scale: Normal     Standing balance support: During functional activity Standing balance-Leahy Scale: Fair                              Cognition Arousal/Alertness: Awake/alert Behavior During Therapy: WFL for tasks assessed/performed Overall Cognitive Status: Within Functional Limits for tasks assessed                                        Exercises Total Joint Exercises Hip ABduction/ADduction: AROM;Right;10 reps;Standing Knee Flexion: AROM;Right;10 reps;Standing Marching in Standing: AROM;Both;10 reps;Standing Standing Hip Extension: AROM;Right;10 reps;Standing    General Comments        Pertinent Vitals/Pain Pain Assessment: 0-10 Pain Score: 2  Pain Location: R hip Pain Descriptors / Indicators: Sore Pain Intervention(s): Monitored during session;Limited activity within patient's tolerance;Repositioned    Home Living                      Prior Function            PT Goals (current goals can now be found in the care plan section) Acute Rehab PT Goals Patient Stated Goal: return home and to independence PT Goal Formulation: With patient/family Time For Goal Achievement: 11/25/16 Potential to Achieve Goals: Good Progress towards PT goals: Progressing toward goals    Frequency    7X/week      PT Plan Current plan remains appropriate    Co-evaluation  AM-PAC PT "6 Clicks" Daily Activity  Outcome Measure  Difficulty turning over in bed (including adjusting bedclothes, sheets and blankets)?: None Difficulty moving from lying on back to sitting on the side of the bed? : None Difficulty sitting down on and standing up from a chair with arms (e.g., wheelchair, bedside commode, etc,.)?: None Help needed moving to and from a bed to chair (including a wheelchair)?: None Help needed walking in hospital room?: A Little Help needed climbing 3-5 steps with a railing? : A Little 6 Click Score:  22    End of Session Equipment Utilized During Treatment: Gait belt Activity Tolerance: Patient tolerated treatment well Patient left: with call bell/phone within reach;in bed;with family/visitor present (sitting EOB) Nurse Communication: Mobility status PT Visit Diagnosis: Other abnormalities of gait and mobility (R26.89);Pain Pain - Right/Left: Right Pain - part of body: Hip     Time: 4098-1191 PT Time Calculation (min) (ACUTE ONLY): 16 min  Charges:  $Therapeutic Exercise: 8-22 mins                    G Codes:       Kallie Locks, [place] Pager [phone] Acute Rehab    Sheral Apley 11/12/2016, 3:01 PM

## 2016-11-15 ENCOUNTER — Encounter (INDEPENDENT_AMBULATORY_CARE_PROVIDER_SITE_OTHER): Payer: Self-pay | Admitting: Orthopaedic Surgery

## 2016-11-15 ENCOUNTER — Other Ambulatory Visit (HOSPITAL_COMMUNITY): Payer: Self-pay | Admitting: Orthopaedic Surgery

## 2016-11-16 ENCOUNTER — Other Ambulatory Visit (INDEPENDENT_AMBULATORY_CARE_PROVIDER_SITE_OTHER): Payer: Self-pay

## 2016-11-16 MED ORDER — OXYCODONE-ACETAMINOPHEN 5-325 MG PO TABS: 1.0000 | ORAL_TABLET | Freq: Four times a day (QID) | ORAL | 0 refills | Status: DC | PRN

## 2016-11-25 ENCOUNTER — Ambulatory Visit (INDEPENDENT_AMBULATORY_CARE_PROVIDER_SITE_OTHER): Payer: BLUE CROSS/BLUE SHIELD | Admitting: Orthopaedic Surgery

## 2016-11-25 DIAGNOSIS — Z96641 Presence of right artificial hip joint: Secondary | ICD-10-CM

## 2016-11-25 MED ORDER — HYDROCODONE-ACETAMINOPHEN 5-325 MG PO TABS: 1.0000 | ORAL_TABLET | Freq: Four times a day (QID) | ORAL | 0 refills | Status: DC | PRN

## 2016-11-25 NOTE — Progress Notes (Signed)
The patient is 2 weeks status post a right total hip arthroplasty through direct injury approach. He is doing well. Exam of lidocaine.  On exam his incision looks great. There is a small area of abrasion of the very top of the incision, put ointment on a daily after shower. I removed his old Steri-Strips and placed new ones. He does have a moderate seroma and I drained about 50 mL fluid off of the hip area. He tolerated this well.  All questions were encouraged and answered. We'll give him a note to return to work on August 20. I did refill his pain medication and went down hydrocodone at this standpoint. He'll stop his aspirin twice a day as well. Is doing about the cane until he is more comfortably without it.

## 2016-12-07 ENCOUNTER — Inpatient Hospital Stay (INDEPENDENT_AMBULATORY_CARE_PROVIDER_SITE_OTHER): Payer: BLUE CROSS/BLUE SHIELD | Admitting: Orthopaedic Surgery

## 2016-12-23 ENCOUNTER — Ambulatory Visit (INDEPENDENT_AMBULATORY_CARE_PROVIDER_SITE_OTHER): Payer: BLUE CROSS/BLUE SHIELD | Admitting: Orthopaedic Surgery

## 2016-12-23 DIAGNOSIS — Z96641 Presence of right artificial hip joint: Secondary | ICD-10-CM

## 2016-12-23 DIAGNOSIS — M25561 Pain in right knee: Secondary | ICD-10-CM

## 2016-12-23 MED ORDER — METHYLPREDNISOLONE ACETATE 40 MG/ML IJ SUSP
40.0000 mg | INTRAMUSCULAR | Status: AC | PRN
  Administered 2016-12-23: 40 mg via INTRA_ARTICULAR

## 2016-12-23 MED ORDER — LIDOCAINE HCL 1 % IJ SOLN
3.0000 mL | INTRAMUSCULAR | Status: AC | PRN
  Administered 2016-12-23: 3 mL

## 2016-12-23 NOTE — Progress Notes (Signed)
Office Visit Note   Patient: Devin Romero           Date of Birth: 1985-12-25           MRN: [id number] Visit Date: 12/23/2016              Requested by: Esperanza Richters, PA-C [address] [place], [place] [address] PCP: Esperanza Richters, PA-C   Assessment & Plan: Visit Diagnoses:  1. Status post total replacement of right hip   2. Acute pain of right knee     Plan: I was able to show him stretching exercises for trochanteric bursitis. Also offered him a steroid injection in his right knee The risks and benefits of this and the rationale behind the injection he is agreeable to this. He tolerated the injection well. I like see him back for another visit in 4-5 weeks' 0 is doing overall. No x-rays are needed. If he looks good at that visit he'll be released to follow-up at 6 months to a year postoperative.  Follow-Up Instructions: Return in about 4 weeks (around 01/20/2017).   Orders:  No orders of the defined types were placed in this encounter.  No orders of the defined types were placed in this encounter.     Procedures: Large Joint Inj Date/Time: 12/23/2016 3:09 PM Performed by: Kathryne Hitch Authorized by: Kathryne Hitch   Location:  Knee Site:  R knee Ultrasound Guidance: No   Fluoroscopic Guidance: No   Arthrogram: No   Medications:  3 mL lidocaine 1 %; 40 mg methylPREDNISolone acetate 40 MG/ML     Clinical Data: No additional findings.   Subjective: No chief complaint on file. Patient is well-known to me. He is now 6 weeks out from a right total hip arthroplasty. He said he is making good progress. He has been experiencing deep knee pain though. He said hip is doing well. He is walking without assistive devices and back to work as well. He does have some pain in her lateral aspect of his hip this just occurred walking long distances to the cafeteria at his job.  HPI  Review of Systems He is alert and oriented 3 in no  acute distress. He denies any chest pain, fever, chills, nausea, vomiting. He also denies any shortness of breath  Objective: Vital Signs: There were no vitals taken for this visit.  Physical Exam  Ortho Exam Examination of his right hip shows the incision is healed well. There is no significant seroma this point. He has some pain over trochanteric area. His range of motion is improving. His right knee shows just a slight effusion with lateral joint line tenderness. He does have some IT band pain as well. Specialty Comments:  No specialty comments available.  Imaging: No results found.   PMFS History: Patient Active Problem List   Diagnosis Date Noted  . Avascular necrosis of hip, right (HCC) 11/11/2016  . Status post total replacement of right hip 11/11/2016  . Smoker 06/05/2015   Past Medical History:  Diagnosis Date  . AVN (avascular necrosis of bone) (HCC) 10/2016   Right Hip  . Headache    Migraines- as a child    Family History  Problem Relation Age of Onset  . Hyperlipidemia Mother   . Hypertension Maternal Grandmother     Past Surgical History:  Procedure Laterality Date  . TOTAL HIP ARTHROPLASTY Right 11/11/2016  . TOTAL HIP ARTHROPLASTY Right 11/11/2016   Procedure: RIGHT TOTAL  HIP ARTHROPLASTY ANTERIOR APPROACH;  Surgeon: Kathryne HitchBlackman, Jaman Aro Y, MD;  [facility];  Service: Orthopedics;  Laterality: Right;  . TYMPANOSTOMY TUBE PLACEMENT Left   . WISDOM TOOTH EXTRACTION     Social History   Occupational History  . Not on file.   Social History Main Topics  . Smoking status: Current Every Day Smoker    Packs/day: 0.50    Years: 14.00  . Smokeless tobacco: Never Used  . Alcohol use 1.2 oz/week    2 Cans of beer per week  . Drug use: Yes    Types: Marijuana     Comment: Monthy - last time 7  . Sexual activity: Yes

## 2017-02-07 ENCOUNTER — Ambulatory Visit (INDEPENDENT_AMBULATORY_CARE_PROVIDER_SITE_OTHER): Payer: BLUE CROSS/BLUE SHIELD | Admitting: Orthopaedic Surgery

## 2017-02-07 DIAGNOSIS — Z96641 Presence of right artificial hip joint: Secondary | ICD-10-CM

## 2017-02-07 NOTE — Progress Notes (Signed)
The patient is now 3 months status post a right total hip arthroplasty through direct anterior approach secondary to avascular necrosis. He says he is doing great and has no issues at all except for some pain when he is getting in and out of a low setting car.  On exam I can easily put his right hip the range of motion with no difficulty at all. His leg lengths are equal. He is neurovascular intact.  At this point he'll continue increase his activities. I will see him back in 6 months for repeat low AP pelvis and a lateral of his right operative hip.

## 2017-05-04 ENCOUNTER — Encounter: Payer: Self-pay | Admitting: Medical

## 2017-05-04 ENCOUNTER — Ambulatory Visit: Payer: BLUE CROSS/BLUE SHIELD | Admitting: Medical

## 2017-05-04 VITALS — BP 115/57 | HR 59 | Temp 98.2°F | Resp 16 | Ht 72.0 in | Wt 218.4 lb

## 2017-05-04 DIAGNOSIS — J029 Acute pharyngitis, unspecified: Secondary | ICD-10-CM | POA: Diagnosis not present

## 2017-05-04 DIAGNOSIS — H6690 Otitis media, unspecified, unspecified ear: Secondary | ICD-10-CM | POA: Diagnosis not present

## 2017-05-04 DIAGNOSIS — R0981 Nasal congestion: Secondary | ICD-10-CM

## 2017-05-04 LAB — POCT RAPID STREP A (OFFICE): Rapid Strep A Screen: NEGATIVE

## 2017-05-04 MED ORDER — FLUTICASONE PROPIONATE 50 MCG/ACT NA SUSP: 2.0000 | Freq: Every day | NASAL | 1 refills | Status: DC

## 2017-05-04 MED ORDER — AMOXICILLIN-POT CLAVULANATE 875-125 MG PO TABS: 1.0000 | ORAL_TABLET | Freq: Two times a day (BID) | ORAL | 0 refills | Status: DC

## 2017-05-04 NOTE — Progress Notes (Signed)
Subjective:    Patient ID: Devin Romero, male    DOB: 1985-06-05, 32 y.o.   MRN: [id number]  HPI  Pt in sick for 2 weeks. He got some sinus pressure and nasal congestion first. Pt has st for last 4 days. Not getting better as he expected. A lot of pain swollowing. No fever, no chills or sweats. No body aches.   Monday fatigued and chills.   Pt wife had bronchitis late December.   Review of Systems  HENT: Positive for congestion, ear pain and sore throat. Negative for postnasal drip, sneezing and trouble swallowing.        Faint nasal congestion. Some ear discomfort left side. Hx of ear infection as child.  Respiratory: Negative for cough, chest tightness, shortness of breath and wheezing.   Cardiovascular: Negative for chest pain and palpitations.  Gastrointestinal: Negative for abdominal pain.  Genitourinary: Negative for flank pain.  Musculoskeletal: Negative for back pain.  Skin: Negative for rash.  Neurological: Negative for dizziness and headaches.  Hematological: Positive for adenopathy. Does not bruise/bleed easily.  Psychiatric/Behavioral: Negative for behavioral problems, confusion and self-injury. The patient is not nervous/anxious.     Past Medical History:  Diagnosis Date  . AVN (avascular necrosis of bone) (HCC) 10/2016   Right Hip  . Headache    Migraines- as a child     Social History   Socioeconomic History  . Marital status: Married    Spouse name: Not on file  . Number of children: Not on file  . Years of education: Not on file  . Highest education level: Not on file  Social Needs  . Financial resource strain: Not on file  . Food insecurity - worry: Not on file  . Food insecurity - inability: Not on file  . Transportation needs - medical: Not on file  . Transportation needs - non-medical: Not on file  Occupational History  . Not on file  Tobacco Use  . Smoking status: Current Every Day Smoker    Packs/day: 0.50    Years: 14.00    Pack  years: 7.00  . Smokeless tobacco: Never Used  Substance and Sexual Activity  . Alcohol use: Yes    Alcohol/week: 1.2 oz    Types: 2 Cans of beer per week  . Drug use: Yes    Types: Marijuana    Comment: Monthy - last time 7  . Sexual activity: Yes  Other Topics Concern  . Not on file  Social History Narrative  . Not on file    Past Surgical History:  Procedure Laterality Date  . TOTAL HIP ARTHROPLASTY Right 11/11/2016  . TOTAL HIP ARTHROPLASTY Right 11/11/2016   Procedure: RIGHT TOTAL HIP ARTHROPLASTY ANTERIOR APPROACH;  Surgeon: Kathryne HitchBlackman, Christopher Y, MD;  Location: [facility] OR;  Service: Orthopedics;  Laterality: Right;  . TYMPANOSTOMY TUBE PLACEMENT Left   . WISDOM TOOTH EXTRACTION      Family History  Problem Relation Age of Onset  . Hyperlipidemia Mother   . Hypertension Maternal Grandmother     No Known Allergies  No current outpatient medications on file prior to visit.   No current facility-administered medications on file prior to visit.     BP (!) 115/57   Pulse (!) 59   Temp 98.2 F (36.8 C) (Oral)   Resp 16   Ht 6' (1.829 m)   Wt 218 lb 6.4 oz (99.1 kg)   SpO2 100%   BMI 29.62 kg/m  Objective:   Physical Exam  General  Mental Status - Alert. General Appearance - Well groomed. Not in acute distress.  Skin Rashes- No Rashes.  HEENT Head- Normal. Ear Auditory Canal - Left- Normal. Right - Normal.Tympanic Membrane- Left-moderate red.  Right- Normal. Eye Sclera/Conjunctiva- Left- Normal. Right- Normal. Nose & Sinuses Nasal Mucosa- Left-  Boggy and Congested. Right-  Boggy and  Congested.Bilateral no  maxillary and no  frontal sinus pressure. Mouth & Throat Lips: Upper Lip- Normal: no dryness, cracking, pallor, cyanosis, or vesicular eruption. Lower Lip-Normal: no dryness, cracking, pallor, cyanosis or vesicular eruption. Buccal Mucosa- Bilateral- No Aphthous ulcers. Oropharynx- No Discharge or Erythema. Tonsils: Characteristics- Bilateral-  moderate Erythema . Size/Enlargement- Bilateral- 1+ enlargement with mild uvula enlargement. Discharge- bilateral-None.  Neck Neck- Supple. No Masses. Mild enlarged submandibular lymph nodes.   Chest and Lung Exam Auscultation: Breath Sounds:-Clear even and unlabored.  Cardiovascular Auscultation:Rythm- Regular, rate and rhythm. Murmurs & Other Heart Sounds:Ausculatation of the heart reveal- No Murmurs.  Lymphatic Head & Neck General Head & Neck Lymphatics: Bilateral: Description- see neck exam.       Assessment & Plan:  You appear to have left ear infection with possible strep throat(despite rapid strep negative). Will rx augmentin  Antibiotic. For nasal congestion rx flonase.  You should get better. If symptoms persist or change let us know.  Follow 7 days or prn

## 2017-05-04 NOTE — Patient Instructions (Signed)
You appear to have left ear infection with possible strep throat(despite rapid strep negative). Will rx augmentin  Antibiotic. For nasal congestion rx flonase.  You should get better. If symptoms persist or change let us know.  Follow 7 days or prn

## 2017-08-08 ENCOUNTER — Encounter (INDEPENDENT_AMBULATORY_CARE_PROVIDER_SITE_OTHER): Payer: Self-pay | Admitting: Orthopaedic Surgery

## 2017-08-08 ENCOUNTER — Ambulatory Visit (INDEPENDENT_AMBULATORY_CARE_PROVIDER_SITE_OTHER): Payer: BLUE CROSS/BLUE SHIELD | Admitting: Orthopaedic Surgery

## 2017-08-08 ENCOUNTER — Ambulatory Visit (INDEPENDENT_AMBULATORY_CARE_PROVIDER_SITE_OTHER): Payer: BLUE CROSS/BLUE SHIELD

## 2017-08-08 DIAGNOSIS — Z96641 Presence of right artificial hip joint: Secondary | ICD-10-CM

## 2017-08-08 NOTE — Progress Notes (Signed)
The patient is now 9 months status post a right total hip arthroplasty.  He is only 32 years old and this was to treat end-stage avascular necrosis of his right hip.  He did have a small nidus of avascular necrosis in the left hip femoral head but so far remains asymptomatic with his left hip.  He said both hips are doing well he has no problems at all.  He says he has good range of motion strength and at times forgets he does have a right hip replacement.  On exam I can move both hips around easily without any significant issues at all.  Compressing both hips causes no pain.  An AP pelvis and lateral of his right hip shows a well-seated implant on the right side.  So far his left hip looks stable in terms of no acute findings of avascular necrosis on plain films but we did go over his old MRI showing a nidus of avascular necrosis in his left hip femoral head.  At this point he will follow-up as needed.  He understands that if there is any issues with his right or left hip at any time we would need to see him.  All questions concerns were answered and addressed.

## 2018-07-19 ENCOUNTER — Telehealth (INDEPENDENT_AMBULATORY_CARE_PROVIDER_SITE_OTHER): Payer: Self-pay | Admitting: Radiology

## 2018-07-19 NOTE — Telephone Encounter (Signed)
Number not available. Unable to ask screening questions.   Do you have now or have you had in the past 7 days a fever and/or chills? Do you have now or have you had in the past 7 days a cough? Do you have now or have you had in the last 7 days nausea, vomiting or abdominal pain? Have you been exposed to anyone who has tested positive for COVID-19?

## 2018-07-20 ENCOUNTER — Ambulatory Visit (INDEPENDENT_AMBULATORY_CARE_PROVIDER_SITE_OTHER): Payer: BLUE CROSS/BLUE SHIELD | Admitting: Orthopaedic Surgery

## 2018-07-20 ENCOUNTER — Ambulatory Visit (INDEPENDENT_AMBULATORY_CARE_PROVIDER_SITE_OTHER): Payer: BLUE CROSS/BLUE SHIELD

## 2018-07-20 ENCOUNTER — Encounter (INDEPENDENT_AMBULATORY_CARE_PROVIDER_SITE_OTHER): Payer: Self-pay | Admitting: Orthopaedic Surgery

## 2018-07-20 ENCOUNTER — Other Ambulatory Visit: Payer: Self-pay

## 2018-07-20 DIAGNOSIS — Z96641 Presence of right artificial hip joint: Secondary | ICD-10-CM | POA: Diagnosis not present

## 2018-07-20 DIAGNOSIS — M7060 Trochanteric bursitis, unspecified hip: Secondary | ICD-10-CM

## 2018-07-20 DIAGNOSIS — M25551 Pain in right hip: Secondary | ICD-10-CM

## 2018-07-20 DIAGNOSIS — Z720 Tobacco use: Secondary | ICD-10-CM

## 2018-07-20 MED ORDER — DICLOFENAC SODIUM 75 MG PO TBEC: 75.0000 mg | DELAYED_RELEASE_TABLET | Freq: Two times a day (BID) | ORAL | 3 refills | Status: DC | PRN

## 2018-07-20 MED ORDER — METHYLPREDNISOLONE 4 MG PO TABS: ORAL_TABLET | ORAL | 0 refills | Status: DC

## 2018-07-20 MED ORDER — HYDROCODONE-ACETAMINOPHEN 5-325 MG PO TABS: 1.0000 | ORAL_TABLET | Freq: Four times a day (QID) | ORAL | 0 refills | Status: DC | PRN

## 2018-07-20 NOTE — Progress Notes (Signed)
Office Visit Note   Patient: Devin Romero           Date of Birth: [birth year]           MRN: [id number] Visit Date: 07/20/2018              Requested by: Esperanza Richters, PA-C [address] [place], [place] [address] PCP: Esperanza Richters, PA-C   Assessment & Plan: Visit Diagnoses:  1. Pain in right hip   2. Status post total replacement of right hip     Plan: Hopefully this is just an inflammatory process that he is dealing with.  It does not appear to be an infection or loosening of the hardware.  I would like to try a 6-day steroid taper as well as diclofenac and some hydrocodone.  I did show him stretching exercises to try for the trochanteric bursitis and I think this is going to help him as well.  All questions and concerns were answered and addressed.  I would like to see him back in 4 weeks for repeat exam.  Follow-Up Instructions: Return in about 4 weeks (around 08/17/2018).   Orders:  Orders Placed This Encounter  Procedures  . XR HIP UNILAT W OR W/O PELVIS 1V RIGHT   Meds ordered this encounter  Medications  . methylPREDNISolone (MEDROL) 4 MG tablet    Sig: Medrol dose pack. Take as instructed    Dispense:  21 tablet    Refill:  0  . HYDROcodone-acetaminophen (NORCO/VICODIN) 5-325 MG tablet    Sig: Take 1 tablet by mouth every 6 (six) hours as needed for moderate pain.    Dispense:  30 tablet    Refill:  0  . diclofenac (VOLTAREN) 75 MG EC tablet    Sig: Take 1 tablet (75 mg total) by mouth 2 (two) times daily between meals as needed.    Dispense:  60 tablet    Refill:  3      Procedures: No procedures performed   Clinical Data: No additional findings.   Subjective: Chief Complaint  Patient presents with  . Right Leg - Pain  Patient is a very pleasant 33 year old gentleman who is 20 months out from a right total hip arthroplasty that was done secondary to avascular necrosis.  He is been doing well until a few days ago he got  significantly uncomfortable in terms of pain around his right hip.  Even started shaking due to the pain but he denies any fever chills.  Denies any low back pain.  He was kind of a constant pain in his thigh.  Some lateral but also some medial.  He denies any injuries.  He still denies any left hip pain he does have a small nidus of avascular post his left hip but he still has no groin pain the left side.  He is walking today without a limp or without assistive device but he still has significant pain he states.  HPI  Review of Systems He currently denies any headache, chest pain, shortness of breath, fever, chills, nausea, vomiting  Objective: Vital Signs: There were no vitals taken for this visit.  Physical Exam He is alert and orient x3 and in no acute distress Ortho Exam Examination of his right hip shows that it moves fluidly with no blocks to rotation.  There is no significant pain in the groin at all.  He does have pain to palpation of the trochanteric area but also some medial thigh  pain.  The remainder of his right lower extremity exam is entirely normal.  Incision looks good.  There is no swelling.  His left hip exam is normal. Specialty Comments:  No specialty comments available.  Imaging: Xr Hip Unilat W Or W/o Pelvis 1v Right  Result Date: 07/20/2018 An AP pelvis and lateral of the right hip shows a well-seated total hip arthroplasty with no evidence of loosening or complicating features.  The left hip appears normal thus far in terms of the joint space in someone with a known history of left hip avascular necrosis.    PMFS History: Patient Active Problem List   Diagnosis Date Noted  . Avascular necrosis of hip, right (HCC) 11/11/2016  . Status post total replacement of right hip 11/11/2016  . Smoker 06/05/2015   Past Medical History:  Diagnosis Date  . AVN (avascular necrosis of bone) (HCC) 10/2016   Right Hip  . Headache    Migraines- as a child    Family History   Problem Relation Age of Onset  . Hyperlipidemia Mother   . Hypertension Maternal Grandmother     Past Surgical History:  Procedure Laterality Date  . TOTAL HIP ARTHROPLASTY Right 11/11/2016  . TOTAL HIP ARTHROPLASTY Right 11/11/2016   Procedure: RIGHT TOTAL HIP ARTHROPLASTY ANTERIOR APPROACH;  Surgeon: Kathryne Hitch, MD;  Location: [facility] OR;  Service: Orthopedics;  Laterality: Right;  . TYMPANOSTOMY TUBE PLACEMENT Left   . WISDOM TOOTH EXTRACTION     Social History   Occupational History  . Not on file  Tobacco Use  . Smoking status: Current Every Day Smoker    Packs/day: 0.50    Years: 14.00    Pack years: 7.00  . Smokeless tobacco: Never Used  Substance and Sexual Activity  . Alcohol use: Yes    Alcohol/week: 2.0 standard drinks    Types: 2 Cans of beer per week  . Drug use: Yes    Types: Marijuana    Comment: Monthy - last time 7  . Sexual activity: Yes

## 2018-08-17 ENCOUNTER — Ambulatory Visit (INDEPENDENT_AMBULATORY_CARE_PROVIDER_SITE_OTHER): Payer: BLUE CROSS/BLUE SHIELD | Admitting: Orthopaedic Surgery

## 2018-08-17 ENCOUNTER — Encounter (INDEPENDENT_AMBULATORY_CARE_PROVIDER_SITE_OTHER): Payer: Self-pay | Admitting: Orthopaedic Surgery

## 2018-08-17 ENCOUNTER — Other Ambulatory Visit: Payer: Self-pay

## 2018-08-17 DIAGNOSIS — M25551 Pain in right hip: Secondary | ICD-10-CM

## 2018-08-17 DIAGNOSIS — Z96641 Presence of right artificial hip joint: Secondary | ICD-10-CM | POA: Diagnosis not present

## 2018-08-17 NOTE — Progress Notes (Signed)
The patient is being seen in follow-up for his right hip.  We performed a right total hip arthroplasty in July 2018 secondary to avascular necrosis.  He has been doing well for a long period time but then developed significant pain around his right hip recently and I feel this is more of a tendinitis in the trochanteric bursitis.  I put him on a 6-day steroid taper and diclofenac twice a day.  He says is doing much better overall but still not 100%.  But it is much better than his last visit.  I also had him on diclofenac but he is not taking that right now.  On exam his right hip moves fluidly and fully with no obvious discomfort.  His pain is slightly present to compression of the hip and with palpation of the trochanteric area but is much less than his last visit.  I might like him to take his diclofenac twice a day for at least the next 2 to 4 weeks.  He understands this as well.  All question concerns were answered and addressed.  We will see him back in a month to see how he is doing overall.  If he continues to have any problems with pain we would need to obtain an MRI of his right hip

## 2018-10-05 DIAGNOSIS — R21 Rash and other nonspecific skin eruption: Secondary | ICD-10-CM | POA: Diagnosis not present

## 2018-12-18 DIAGNOSIS — F17200 Nicotine dependence, unspecified, uncomplicated: Secondary | ICD-10-CM | POA: Diagnosis not present

## 2018-12-18 DIAGNOSIS — Z7189 Other specified counseling: Secondary | ICD-10-CM | POA: Diagnosis not present

## 2018-12-18 DIAGNOSIS — Z20828 Contact with and (suspected) exposure to other viral communicable diseases: Secondary | ICD-10-CM | POA: Diagnosis not present

## 2019-02-08 ENCOUNTER — Other Ambulatory Visit: Payer: Self-pay

## 2019-02-08 ENCOUNTER — Ambulatory Visit (INDEPENDENT_AMBULATORY_CARE_PROVIDER_SITE_OTHER): Payer: BC Managed Care – PPO | Admitting: Medical

## 2019-02-08 DIAGNOSIS — F17200 Nicotine dependence, unspecified, uncomplicated: Secondary | ICD-10-CM | POA: Diagnosis not present

## 2019-02-08 DIAGNOSIS — R591 Generalized enlarged lymph nodes: Secondary | ICD-10-CM

## 2019-02-08 MED ORDER — AMOXICILLIN-POT CLAVULANATE 875-125 MG PO TABS: 1.0000 | ORAL_TABLET | Freq: Two times a day (BID) | ORAL | 0 refills | Status: DC

## 2019-02-08 MED ORDER — BUPROPION HCL ER (XL) 150 MG PO TB24: 150.0000 mg | ORAL_TABLET | Freq: Every day | ORAL | 0 refills | Status: DC

## 2019-02-08 MED ORDER — BUPROPION HCL ER (XL) 150 MG PO TB24: 150.0000 mg | ORAL_TABLET | Freq: Every day | ORAL | 1 refills | Status: DC

## 2019-02-08 NOTE — Progress Notes (Signed)
   Subjective:    Patient ID: Grant Swager, male    DOB: 1985-07-01, 33 y.o.   MRN: [id number]  HPI  Virtual Visit via Video Note  I connected with Su Monks on 02/08/19 at 11:20 AM EDT by a video enabled telemedicine application and verified that I am speaking with the correct person using two identifiers.  Location: Patient: home Provider: office.   I discussed the limitations of evaluation and management by telemedicine and the availability of in person appointments. The patient expressed understanding and agreed to proceed.  Pt did not check vitals today.    History of Present Illness: Pt in with some mild  rt side submandibular area fullness and pain for about 2 months). When moves/turns head sometimes pain can hurt. Recently over weekend was constant pain. No fever, no chills or sweats. No st. No obvious teeth.  Also smoker for 15 years. Pack a day up until last 2 years when just smoked 1/2 pack a day. Pt in past nicotine patch but did not work.   Observations/Objective: General-no acute distress, pleasant, oriented. Lungs- on inspection lungs appear unlabored. Neck- no tracheal deviation or jvd on inspection. Neuro- gross motor function appears intact.] heent- no sinus pressure. No swelling. Rt submandibular area looks normal but pt states mild tender at times.  Assessment and Plan: For possible enlarged lymph node with regional infection/source of infection rx augmentin. Then on follow up in 2 weeks palpate node and examen area for infection. May get cbc on next visit. May nee imaging studies or ent depending on follow up exam and potential labs.  For smoking cessation rx wellbutrin.  Follow up 2 weeks or as needed.  Follow Up Instructions:    I discussed the assessment and treatment plan with the patient. The patient was provided an opportunity to ask questions and all were answered. The patient agreed with the plan and demonstrated an understanding of the  instructions.   The patient was advised to call back or seek an in-person evaluation if the symptoms worsen or if the condition fails to improve as anticipated.  I provided 25  minutes of non-face-to-face time during this encounter.   Mackie Pai, PA-C   Review of Systems  Constitutional: Negative for chills, fatigue and fever.  HENT: Negative for congestion, drooling, ear discharge, ear pain, nosebleeds, postnasal drip, sinus pressure, sinus pain, sore throat and tinnitus.   Respiratory: Negative for cough, chest tightness, shortness of breath and wheezing.   Hematological: Positive for adenopathy. Does not bruise/bleed easily.       Objective:   Physical Exam        Assessment & Plan:

## 2019-02-08 NOTE — Patient Instructions (Signed)
For possible enlarged lymph node with regional infection/source of infection rx augmentin. Then on follow up in 2 weeks palpate node and examen area for infection. May get cbc on next visit. May nee imaging studies or ent depending on follow up exam and potential labs.  For smoking cessation rx wellbutrin.  Follow up 2 weeks or as needed.

## 2019-03-09 ENCOUNTER — Other Ambulatory Visit: Payer: Self-pay

## 2019-03-12 ENCOUNTER — Ambulatory Visit (HOSPITAL_BASED_OUTPATIENT_CLINIC_OR_DEPARTMENT_OTHER)
Admission: RE | Admit: 2019-03-12 | Discharge: 2019-03-12 | Disposition: A | Discharge status: Home / Self Care | Payer: BC Managed Care – PPO | Source: Ambulatory Visit | Attending: Medical | Admitting: Medical

## 2019-03-12 ENCOUNTER — Encounter: Payer: Self-pay | Admitting: Medical

## 2019-03-12 ENCOUNTER — Ambulatory Visit: Payer: BC Managed Care – PPO | Admitting: Medical

## 2019-03-12 ENCOUNTER — Other Ambulatory Visit: Payer: Self-pay

## 2019-03-12 VITALS — BP 115/62 | HR 61 | Temp 97.6°F | Resp 16 | Ht 72.0 in | Wt 215.8 lb

## 2019-03-12 DIAGNOSIS — R0982 Postnasal drip: Secondary | ICD-10-CM | POA: Diagnosis not present

## 2019-03-12 DIAGNOSIS — R591 Generalized enlarged lymph nodes: Secondary | ICD-10-CM | POA: Diagnosis not present

## 2019-03-12 DIAGNOSIS — F17200 Nicotine dependence, unspecified, uncomplicated: Secondary | ICD-10-CM | POA: Insufficient documentation

## 2019-03-12 DIAGNOSIS — R05 Cough: Secondary | ICD-10-CM

## 2019-03-12 DIAGNOSIS — R42 Dizziness and giddiness: Secondary | ICD-10-CM | POA: Diagnosis not present

## 2019-03-12 DIAGNOSIS — R059 Cough, unspecified: Secondary | ICD-10-CM

## 2019-03-12 NOTE — Patient Instructions (Signed)
You do have palpable lymph node in the anterior cervical node region right side.  This is despite being on the antibiotics.  I do think since it is palpable on exam that we should do CBC to evaluate for infection fighting cells and I will go ahead and refer you to ENT for evaluation.  You might need ultrasound or other investigation.  We will update you on CBC results when those are in.  For history of smoking and mild daily cough, I did go ahead and place chest x-ray.  You do have some postnasal drainage on exam and reports some occasional sneezing so I did go ahead and prescribe Flonase nasal spray.  He had that one episode of dizziness and head pressure which was self-limiting.  He had good neurologic exam presently.  If you do have recurrent dizziness again then would check your blood pressure and pulse.  If dizziness with dizziness that last more than 10 minutes then advised same-day evaluation here.  If you have dizziness with severe headache or motor or sensory deficit as discussed then recommend ED evaluation.  Follow-up in 3 weeks or as needed.  This would depend if we can get you in with ENT  before then.

## 2019-03-12 NOTE — Progress Notes (Signed)
Subjective:    Patient ID: Devin Romero, male    DOB: December 19, 1985, 33 y.o.   MRN: [id number]  HPI  Pt in for follow up.  He had swollen area in rt submandibular area for which I gave him augmentin. He states it did help but he can still feel mild swelling. He thinks lymph node might be worse on days he smokes. He had some st on last visit but that visit was virtual visit.  Pt does have history of smoking in the past. He was smoking about 15 cigarettes a day. Now about 5-7 a day. Pt is on wellbutrin.  Pt states 1 week ago he had transient of head pressure and ear pressure. He states ear pressure on both sides. Lasted for about one minute. He did not check his bp at that time. His bp had no batteries at that time. Then felt light headed for about 20 minutes. No nasal congestion around that time but was sneezing some.    Review of Systems  Constitutional: Negative for chills, fatigue and fever.  HENT: Negative for dental problem.   Respiratory: Positive for cough. Negative for chest tightness, shortness of breath and wheezing.        Some daily mild smoker. Minimal productive at times.  Cardiovascular: Negative for chest pain and palpitations.  Gastrointestinal: Negative for abdominal pain.  Musculoskeletal: Negative for back pain.  Neurological: Negative for dizziness, facial asymmetry, speech difficulty and light-headedness.  Hematological: Positive for adenopathy. Does not bruise/bleed easily.  Psychiatric/Behavioral: Negative for confusion.    Past Medical History:  Diagnosis Date  . AVN (avascular necrosis of bone) (HCC) 10/2016   Right Hip  . Headache    Migraines- as a child     Social History   Socioeconomic History  . Marital status: Married    Spouse name: Not on file  . Number of children: Not on file  . Years of education: Not on file  . Highest education level: Not on file  Occupational History  . Not on file  Social Needs  . Financial resource strain:  Not on file  . Food insecurity    Worry: Not on file    Inability: Not on file  . Transportation needs    Medical: Not on file    Non-medical: Not on file  Tobacco Use  . Smoking status: Current Every Day Smoker    Packs/day: 0.50    Years: 14.00    Pack years: 7.00  . Smokeless tobacco: Never Used  Substance and Sexual Activity  . Alcohol use: Yes    Alcohol/week: 2.0 standard drinks    Types: 2 Cans of beer per week  . Drug use: Yes    Types: Marijuana    Comment: Monthy - last time 7  . Sexual activity: Yes  Lifestyle  . Physical activity    Days per week: Not on file    Minutes per session: Not on file  . Stress: Not on file  Relationships  . Social Musicianconnections    Talks on phone: Not on file    Gets together: Not on file    Attends religious service: Not on file    Active member of club or organization: Not on file    Attends meetings of clubs or organizations: Not on file    Relationship status: Not on file  . Intimate partner violence    Fear of current or ex partner: Not on file    Emotionally abused: Not  on file    Physically abused: Not on file    Forced sexual activity: Not on file  Other Topics Concern  . Not on file  Social History Narrative  . Not on file    Past Surgical History:  Procedure Laterality Date  . TOTAL HIP ARTHROPLASTY Right 11/11/2016  . TOTAL HIP ARTHROPLASTY Right 11/11/2016   Procedure: RIGHT TOTAL HIP ARTHROPLASTY ANTERIOR APPROACH;  Surgeon: Mcarthur Rossetti, MD;  Location: [facility];  Service: Orthopedics;  Laterality: Right;  . TYMPANOSTOMY TUBE PLACEMENT Left   . WISDOM TOOTH EXTRACTION      Family History  Problem Relation Age of Onset  . Hyperlipidemia Mother   . Hypertension Maternal Grandmother     No Known Allergies  Current Outpatient Medications on File Prior to Visit  Medication Sig Dispense Refill  . buPROPion (WELLBUTRIN XL) 150 MG 24 hr tablet Take 1 tablet (150 mg total) by mouth daily. 30 tablet 1    No current facility-administered medications on file prior to visit.     BP 115/62   Pulse 61   Temp 97.6 F (36.4 C) (Temporal)   Resp 16   Ht 6' (1.829 m)   Wt 215 lb 12.8 oz (97.9 kg)   SpO2 99%   BMI 29.27 kg/m       Objective:   Physical Exam  General  Mental Status - Alert. General Appearance - Well groomed. Not in acute distress.  Skin Rashes- No Rashes.  HEENT Head- Normal. Ear Auditory Canal - Left- Normal. Right - Normal.Tympanic Membrane- Left- Normal. Right- Normal. Eye Sclera/Conjunctiva- Left- Normal. Right- Normal. Nose & Sinuses Nasal Mucosa- Left-  Boggy and Congested. Right-  Boggy and  Congested.Bilateral maxillary and frontal sinus pressure. Mouth & Throat Lips: Upper Lip- Normal: no dryness, cracking, pallor, cyanosis, or vesicular eruption. Lower Lip-Normal: no dryness, cracking, pallor, cyanosis or vesicular eruption. Buccal Mucosa- Bilateral- No Aphthous ulcers. Oropharynx- No Discharge or Erythema. +pnd. Tonsils: Characteristics- Bilateral- mild  Erythema +pnd  Size/Enlargement- Bilateral- No enlargement. Discharge- bilateral-None.  Neck Neck- Supple. No Masses. Rt side mid anterior cervical lymph node mild enlarged and mild tender to palpation   Chest and Lung Exam Auscultation: Breath Sounds:-Clear even and unlabored.  Cardiovascular Auscultation:Rythm- Regular, rate and rhythm. Murmurs & Other Heart Sounds:Ausculatation of the heart reveal- No Murmurs.  Lymphatic Head & Neck General Head & Neck Lymphatics: Bilateral: Description- No Localized lymphadenopathy.       Assessment & Plan:  You do have palpable lymph node in the anterior cervical node region right side.  This is despite being on the antibiotics.  I do think since it is palpable on exam that we should do CBC to evaluate for infection fighting cells and I will go ahead and refer you to ENT for evaluation.  You might need ultrasound or other investigation.  We will  update you on CBC results when those are in.  For history of smoking and mild daily cough, I did go ahead and place chest x-ray.  You do have some postnasal drainage on exam and reports some occasional sneezing so I did go ahead and prescribe Flonase nasal spray.  He had that one episode of dizziness and head pressure which was self-limiting.  He had good neurologic exam presently.  If you do have recurrent dizziness again then would check your blood pressure and pulse.  If dizziness with dizziness that last more than 10 minutes then advised same-day evaluation here.  If you have dizziness  with severe headache or motor or sensory deficit as discussed then recommend ED evaluation.  Follow-up in 3 weeks or as needed.  This would depend if we can get you in with ENT before then.  Esperanza Richters, PA-C   25+ minutes spent with patient.  50% of time spent counseling patient on plan going forward

## 2019-03-13 LAB — COMPREHENSIVE METABOLIC PANEL
ALT: 56 U/L — ABNORMAL HIGH (ref 0–53)
AST: 25 U/L (ref 0–37)
Albumin: 4.7 g/dL (ref 3.5–5.2)
Alkaline Phosphatase: 62 U/L (ref 39–117)
BUN: 13 mg/dL (ref 6–23)
CO2: 30 mEq/L (ref 19–32)
Calcium: 9.6 mg/dL (ref 8.4–10.5)
Chloride: 104 mEq/L (ref 96–112)
Creatinine, Ser: 1.08 mg/dL (ref 0.40–1.50)
GFR: 79.27 mL/min (ref >60.00)
Glucose, Bld: 95 mg/dL (ref 70–99)
Potassium: 4.3 mEq/L (ref 3.5–5.1)
Sodium: 141 mEq/L (ref 135–145)
Total Bilirubin: 0.5 mg/dL (ref 0.2–1.2)
Total Protein: 7.2 g/dL (ref 6.0–8.3)

## 2019-03-13 LAB — CBC WITH DIFFERENTIAL/PLATELET
Basophils Absolute: 0 10*3/uL (ref 0.0–0.1)
Basophils Relative: 0.7 % (ref 0.0–3.0)
Eosinophils Absolute: 0.1 10*3/uL (ref 0.0–0.7)
Eosinophils Relative: 2 % (ref 0.0–5.0)
HCT: 43.9 % (ref 39.0–52.0)
Hemoglobin: 15.2 g/dL (ref 13.0–17.0)
Lymphocytes Relative: 30.8 % (ref 12.0–46.0)
Lymphs Abs: 1.8 10*3/uL (ref 0.7–4.0)
MCHC: 34.7 g/dL (ref 30.0–36.0)
MCV: 90.1 fl (ref 78.0–100.0)
Monocytes Absolute: 0.5 10*3/uL (ref 0.1–1.0)
Monocytes Relative: 8 % (ref 3.0–12.0)
Neutro Abs: 3.5 10*3/uL (ref 1.4–7.7)
Neutrophils Relative %: 58.5 % (ref 43.0–77.0)
Platelets: 213 10*3/uL (ref 150.0–400.0)
RBC: 4.87 Mil/uL (ref 4.22–5.81)
RDW: 12.9 % (ref 11.5–15.5)
WBC: 5.9 10*3/uL (ref 4.0–10.5)

## 2019-03-26 DIAGNOSIS — Z3009 Encounter for other general counseling and advice on contraception: Secondary | ICD-10-CM | POA: Diagnosis not present

## 2019-05-03 DIAGNOSIS — Z302 Encounter for sterilization: Secondary | ICD-10-CM | POA: Diagnosis not present

## 2019-07-18 DIAGNOSIS — Z20822 Contact with and (suspected) exposure to covid-19: Secondary | ICD-10-CM | POA: Diagnosis not present

## 2019-10-29 ENCOUNTER — Other Ambulatory Visit: Payer: Self-pay | Admitting: Medical

## 2019-10-31 ENCOUNTER — Ambulatory Visit: Payer: BC Managed Care – PPO | Admitting: Orthopaedic Surgery

## 2019-10-31 ENCOUNTER — Ambulatory Visit: Payer: Self-pay

## 2019-10-31 ENCOUNTER — Other Ambulatory Visit: Payer: Self-pay

## 2019-10-31 DIAGNOSIS — M87052 Idiopathic aseptic necrosis of left femur: Secondary | ICD-10-CM

## 2019-10-31 DIAGNOSIS — M7061 Trochanteric bursitis, right hip: Secondary | ICD-10-CM | POA: Diagnosis not present

## 2019-10-31 DIAGNOSIS — M25552 Pain in left hip: Secondary | ICD-10-CM

## 2019-10-31 MED ORDER — HYDROCODONE-ACETAMINOPHEN 5-325 MG PO TABS: 1.0000 | ORAL_TABLET | Freq: Four times a day (QID) | ORAL | 0 refills | Status: AC | PRN

## 2019-10-31 NOTE — Progress Notes (Signed)
Office Visit Note   Patient: Devin Romero           Date of Birth: 1985/12/29           MRN: [id number] Visit Date: 10/31/2019              Requested by: Esperanza Richters, PA-C [address] [place],  [place] [address] PCP: Esperanza Richters, PA-C   Assessment & Plan: Visit Diagnoses:  1. Pain in left hip   2. Avascular necrosis of bone of left hip (HCC)   3. Trochanteric bursitis, right hip     Plan: I do feel that likely his avascular process is worsening in his left hip.  At this point it is absolutely worthwhile obtaining a MRI of the left hip specifically to assess the degree of a fascia necrosis and see if this is getting worse enough to consider left hip replacement surgery.  I will have him try topical Voltaren gel over his right hip trochanteric area and I showed him stretching exercises to try.  I will send an occasional hydrocodone for him to try.  We will see him back once he has an MRI of the left hip.  Questions were answered and addressed.  Follow-Up Instructions: No follow-ups on file.   Orders:  Orders Placed This Encounter  Procedures  . XR HIP UNILAT W OR W/O PELVIS 1V LEFT   Meds ordered this encounter  Medications  . HYDROcodone-acetaminophen (NORCO/VICODIN) 5-325 MG tablet    Sig: Take 1-2 tablets by mouth every 6 (six) hours as needed for moderate pain.    Dispense:  30 tablet    Refill:  0      Procedures: No procedures performed   Clinical Data: No additional findings.   Subjective: Chief Complaint  Patient presents with  . Left Hip - Pain  The patient is well-known to me.  He has a history of bilateral hip avascular necrosis which was significantly worse on the right and asymptomatic on the left.  The left hip had only a small nidus of avascular necrosis.  It is only now become recently symptomatic.  He does have a history of a right total hip arthroplasty.  The right hip is hurt recently but only over the trochanteric area  after doing a lot of work where he was having a stoop and bend.  That is calming down somewhat.  His pain in the left hip is mainly in the groin.  Is been slowly getting worse.  He has had no acute changes in medical status.  HPI  Review of Systems He currently denies any headache, chest pain, shortness of breath, fever, chills, nausea, vomiting  Objective: Vital Signs: There were no vitals taken for this visit.  Physical Exam He is alert and oriented x3 and in no acute distress Ortho Exam Examination of his right hip shows that it moves smoothly and fluidly.  There is a little bit of pain over the trochanteric area.  Examination of his left hip shows that there is pain with internal and external rotation and that pain is in the groin. Specialty Comments:  No specialty comments available.  Imaging: XR HIP UNILAT W OR W/O PELVIS 1V LEFT  Result Date: 10/31/2019 An AP pelvis and lateral left hip shows a well-seated total hip arthroplasty on the right hip.  There is a likely nidus of avascular necrosis in the left femoral head that was seen on previous MRI.  This may be  slightly worsening when compared to previous plain films.    PMFS History: Patient Active Problem List   Diagnosis Date Noted  . Avascular necrosis of bone of left hip (HCC) 10/31/2019  . Avascular necrosis of hip, right (HCC) 11/11/2016  . Status post total replacement of right hip 11/11/2016  . Smoker 06/05/2015   Past Medical History:  Diagnosis Date  . AVN (avascular necrosis of bone) (HCC) 10/2016   Right Hip  . Headache    Migraines- as a child    Family History  Problem Relation Age of Onset  . Hyperlipidemia Mother   . Hypertension Maternal Grandmother     Past Surgical History:  Procedure Laterality Date  . TOTAL HIP ARTHROPLASTY Right 11/11/2016  . TOTAL HIP ARTHROPLASTY Right 11/11/2016   Procedure: RIGHT TOTAL HIP ARTHROPLASTY ANTERIOR APPROACH;  Surgeon: Kathryne Hitch, MD;  Location:  [facility];  Service: Orthopedics;  Laterality: Right;  . TYMPANOSTOMY TUBE PLACEMENT Left   . WISDOM TOOTH EXTRACTION     Social History   Occupational History  . Not on file  Tobacco Use  . Smoking status: Current Every Day Smoker    Packs/day: 0.50    Years: 14.00    Pack years: 7.00  . Smokeless tobacco: Never Used  Vaping Use  . Vaping Use: Never used  Substance and Sexual Activity  . Alcohol use: Yes    Alcohol/week: 2.0 standard drinks    Types: 2 Cans of beer per week  . Drug use: Yes    Types: Marijuana    Comment: Monthy - last time 7  . Sexual activity: Yes

## 2019-11-02 ENCOUNTER — Other Ambulatory Visit: Payer: Self-pay

## 2019-11-02 DIAGNOSIS — M25552 Pain in left hip: Secondary | ICD-10-CM

## 2019-11-28 ENCOUNTER — Other Ambulatory Visit: Payer: BC Managed Care – PPO

## 2019-11-29 ENCOUNTER — Other Ambulatory Visit: Payer: Self-pay | Admitting: Medical

## 2019-12-03 ENCOUNTER — Ambulatory Visit
Admission: RE | Admit: 2019-12-03 | Discharge: 2019-12-03 | Disposition: A | Discharge status: Home / Self Care | Payer: BC Managed Care – PPO | Source: Ambulatory Visit | Attending: Orthopaedic Surgery | Admitting: Orthopaedic Surgery

## 2019-12-03 ENCOUNTER — Other Ambulatory Visit: Payer: Self-pay

## 2019-12-03 DIAGNOSIS — M25552 Pain in left hip: Secondary | ICD-10-CM

## 2019-12-10 ENCOUNTER — Ambulatory Visit: Payer: Self-pay

## 2019-12-10 ENCOUNTER — Ambulatory Visit: Payer: BC Managed Care – PPO | Admitting: Physician Assistant

## 2019-12-10 ENCOUNTER — Encounter: Payer: Self-pay | Admitting: Orthopaedic Surgery

## 2019-12-10 DIAGNOSIS — M25511 Pain in right shoulder: Secondary | ICD-10-CM | POA: Diagnosis not present

## 2019-12-10 DIAGNOSIS — M87052 Idiopathic aseptic necrosis of left femur: Secondary | ICD-10-CM | POA: Diagnosis not present

## 2019-12-10 NOTE — Progress Notes (Signed)
Office Visit Note   Patient: Devin Romero           Date of Birth: February 14, 1986           MRN: [id number] Visit Date: 12/10/2019              Requested by: Esperanza Richters, PA-C [address] [place],  [place] [address] PCP: Esperanza Richters, PA-C   Assessment & Plan: Visit Diagnoses:  1. Acute pain of right shoulder   2. Avascular necrosis of bone of left hip (HCC)     Plan: At this point time we will monitor his left hip.  He understands if he has increased hip pain that does not resolve that the the next step would be a left total hip arthroplasty.  Did discuss with him that an arthroscopy for the labral tear is not indicated.  Also discussed with him that and intra-articular injection can make his AVN worse. In regards to the right shoulder offered subacromial injection he defers.  Therefore he is given Thera-Band and home exercise handouts for shoulder.  His pain continues would recommend a subacromial injection of the right shoulder.  Follow-up with [place] as needed.  Questions encouraged and answered at length.  Follow-Up Instructions: No follow-ups on file.   Orders:  Orders Placed This Encounter  Procedures  . XR Shoulder Right   No orders of the defined types were placed in this encounter.     Procedures: No procedures performed   Clinical Data: No additional findings.   Subjective: Chief Complaint  Patient presents with  . Left Hip - Follow-up    HPI Maurine Minister returns today to go over the MRI of his left hip.  He states currently that he is not having no significant hip pain as he was prior to the MRI. MRI left hip dated 12/04/2019 compared with MRI dated October 10, 2016 shows an unchanged small area of avascular necrosis in the anterior superior femoral head.  No acute fracture or dislocation.  Left anterior superior labral tear seen.  Patient also notes that he is having right shoulder pain has been ongoing for the past 4 months no known particular  injury.  He denies any decrease in range of motion.  Denies any numbness tingling down the arm.  Pain is deep within the shoulder.  He does note with overhead activity has increased pain in the shoulder.  Review of Systems  Musculoskeletal: Positive for arthralgias.  See HPI otherwise negative or noncontributory.   Objective: Vital Signs: There were no vitals taken for this visit.  Physical Exam Constitutional:      Appearance: He is not ill-appearing or diaphoretic.  Pulmonary:     Effort: Pulmonary effort is normal.  Neurological:     Mental Status: He is alert and oriented to person, place, and time.  Psychiatric:        Behavior: Behavior normal.     Ortho Exam Bilateral hips good range of motion without pain. Bilateral shoulders 5/5 strength with external and internal rotation against resistance empty can test is negative bilaterally liftoff test negative bilaterally.  Positive impingement on the right negative on the left. Specialty Comments:  No specialty comments available.  Imaging: XR Shoulder Right  Result Date: 12/10/2019 Right shoulder 3 views: Shoulders well located.  Glenohumeral joint is well-maintained.  Subacromial space well-maintained.  No significant arthritic changes.  No acute fractures or bony abnormalities.    PMFS History: Patient Active Problem List  Diagnosis Date Noted  . Avascular necrosis of bone of left hip (HCC) 10/31/2019  . Avascular necrosis of hip, right (HCC) 11/11/2016  . Status post total replacement of right hip 11/11/2016  . Smoker 06/05/2015   Past Medical History:  Diagnosis Date  . AVN (avascular necrosis of bone) (HCC) 10/2016   Right Hip  . Headache    Migraines- as a child    Family History  Problem Relation Age of Onset  . Hyperlipidemia Mother   . Hypertension Maternal Grandmother     Past Surgical History:  Procedure Laterality Date  . TOTAL HIP ARTHROPLASTY Right 11/11/2016  . TOTAL HIP ARTHROPLASTY Right  11/11/2016   Procedure: RIGHT TOTAL HIP ARTHROPLASTY ANTERIOR APPROACH;  Surgeon: Kathryne Hitch, MD;  Location: [facility];  Service: Orthopedics;  Laterality: Right;  . TYMPANOSTOMY TUBE PLACEMENT Left   . WISDOM TOOTH EXTRACTION     Social History   Occupational History  . Not on file  Tobacco Use  . Smoking status: Current Every Day Smoker    Packs/day: 0.50    Years: 14.00    Pack years: 7.00  . Smokeless tobacco: Never Used  Vaping Use  . Vaping Use: Never used  Substance and Sexual Activity  . Alcohol use: Yes    Alcohol/week: 2.0 standard drinks    Types: 2 Cans of beer per week  . Drug use: Yes    Types: Marijuana    Comment: Monthy - last time 7  . Sexual activity: Yes

## 2019-12-30 ENCOUNTER — Other Ambulatory Visit: Payer: Self-pay | Admitting: Medical

## 2020-01-07 ENCOUNTER — Ambulatory Visit: Payer: BC Managed Care – PPO | Admitting: Orthopaedic Surgery

## 2020-02-02 ENCOUNTER — Other Ambulatory Visit: Payer: Self-pay | Admitting: Medical

## 2020-02-12 ENCOUNTER — Other Ambulatory Visit: Payer: Self-pay | Admitting: Medical

## 2020-09-22 ENCOUNTER — Encounter (HOSPITAL_BASED_OUTPATIENT_CLINIC_OR_DEPARTMENT_OTHER): Payer: Self-pay | Admitting: *Deleted

## 2020-09-22 ENCOUNTER — Emergency Department (HOSPITAL_BASED_OUTPATIENT_CLINIC_OR_DEPARTMENT_OTHER): Payer: BC Managed Care – PPO

## 2020-09-22 ENCOUNTER — Emergency Department (HOSPITAL_BASED_OUTPATIENT_CLINIC_OR_DEPARTMENT_OTHER)
Admission: EM | Admit: 2020-09-22 | Discharge: 2020-09-22 | Disposition: A | Discharge status: Home / Self Care | Payer: BC Managed Care – PPO | Attending: Emergency Medicine | Admitting: Emergency Medicine

## 2020-09-22 ENCOUNTER — Other Ambulatory Visit: Payer: Self-pay

## 2020-09-22 DIAGNOSIS — Z96641 Presence of right artificial hip joint: Secondary | ICD-10-CM | POA: Diagnosis not present

## 2020-09-22 DIAGNOSIS — M549 Dorsalgia, unspecified: Secondary | ICD-10-CM | POA: Diagnosis present

## 2020-09-22 DIAGNOSIS — M546 Pain in thoracic spine: Secondary | ICD-10-CM | POA: Insufficient documentation

## 2020-09-22 DIAGNOSIS — Z20822 Contact with and (suspected) exposure to covid-19: Secondary | ICD-10-CM | POA: Diagnosis not present

## 2020-09-22 DIAGNOSIS — R0602 Shortness of breath: Secondary | ICD-10-CM | POA: Diagnosis not present

## 2020-09-22 DIAGNOSIS — R10811 Right upper quadrant abdominal tenderness: Secondary | ICD-10-CM | POA: Diagnosis not present

## 2020-09-22 DIAGNOSIS — F17200 Nicotine dependence, unspecified, uncomplicated: Secondary | ICD-10-CM | POA: Diagnosis not present

## 2020-09-22 DIAGNOSIS — R0600 Dyspnea, unspecified: Secondary | ICD-10-CM | POA: Diagnosis not present

## 2020-09-22 DIAGNOSIS — G8929 Other chronic pain: Secondary | ICD-10-CM

## 2020-09-22 LAB — CBC WITH DIFFERENTIAL/PLATELET
Abs Immature Granulocytes: 0.06 10*3/uL (ref 0.00–0.07)
Basophils Absolute: 0.1 10*3/uL (ref 0.0–0.1)
Basophils Relative: 1 %
Eosinophils Absolute: 0.1 10*3/uL (ref 0.0–0.5)
Eosinophils Relative: 1 %
HCT: 42.9 % (ref 39.0–52.0)
Hemoglobin: 15 g/dL (ref 13.0–17.0)
Immature Granulocytes: 1 %
Lymphocytes Relative: 30 %
Lymphs Abs: 2.1 10*3/uL (ref 0.7–4.0)
MCH: 30 pg (ref 26.0–34.0)
MCHC: 35 g/dL (ref 30.0–36.0)
MCV: 85.8 fL (ref 80.0–100.0)
Monocytes Absolute: 0.6 10*3/uL (ref 0.1–1.0)
Monocytes Relative: 8 %
Neutro Abs: 4.1 10*3/uL (ref 1.7–7.7)
Neutrophils Relative %: 59 %
Platelets: 216 10*3/uL (ref 150–400)
RBC: 5 MIL/uL (ref 4.22–5.81)
RDW: 13 % (ref 11.5–15.5)
WBC: 6.8 10*3/uL (ref 4.0–10.5)
nRBC: 0 % (ref 0.0–0.2)

## 2020-09-22 LAB — COMPREHENSIVE METABOLIC PANEL
ALT: 49 U/L — ABNORMAL HIGH (ref 0–44)
AST: 37 U/L (ref 15–41)
Albumin: 4.4 g/dL (ref 3.5–5.0)
Alkaline Phosphatase: 58 U/L (ref 38–126)
Anion gap: 10 (ref 5–15)
BUN: 9 mg/dL (ref 6–20)
CO2: 25 mmol/L (ref 22–32)
Calcium: 9.2 mg/dL (ref 8.9–10.3)
Chloride: 101 mmol/L (ref 98–111)
Creatinine, Ser: 0.79 mg/dL (ref 0.61–1.24)
GFR, Estimated: >60 mL/min (ref >60)
Glucose, Bld: 101 mg/dL — ABNORMAL HIGH (ref 70–99)
Potassium: 4.1 mmol/L (ref 3.5–5.1)
Sodium: 136 mmol/L (ref 135–145)
Total Bilirubin: 0.5 mg/dL (ref 0.3–1.2)
Total Protein: 8 g/dL (ref 6.5–8.1)

## 2020-09-22 LAB — TROPONIN I (HIGH SENSITIVITY): Troponin I (High Sensitivity): <2 ng/L (ref <18)

## 2020-09-22 LAB — LIPASE, BLOOD: Lipase: 29 U/L (ref 11–51)

## 2020-09-22 NOTE — Discharge Instructions (Signed)
Please read instructions below.  You can alternate Tylenol/acetaminophen and Advil/ibuprofen/Motrin every 4 hours for sore throat, body aches, headache or fever.  Drink plenty of water.  Wash your hands frequently. You have a COVID test pending. Please isolate at home while awaiting your results.  You can follow your results on MyChart. > If your test is negative, stay home until your fever has resolved/your symptoms are improving. > If your test is positive, isolate at home for at least 5 days after the day your symptoms initially began, and THEN at least 24 hours after you are fever-free without the help of medications AND your symptoms are improving. Only once your symptoms are improving and you are fever-free can you come out out of quarantine. Once, then you should wear a mask in public for another 5 days. Follow up with your primary care provider. Return to the ER for significant shortness of breath, uncontrollable vomiting, severe chest pain, or other concerning symptoms.

## 2020-09-22 NOTE — ED Provider Notes (Signed)
, [facility] Provider Note   CSN: [id number] Arrival date & time: 09/22/20  1728     History Chief Complaint  Patient presents with  . Covid Exposure    Devin Romero is a 35 y.o. male presenting for evaluation of shortness of breath.  Patient states he has been having ongoing right-sided upper back pain between his spine and scapula for many months now.  He is having associated dyspnea with the pains.  He has been worked up outpatient by pulmonology with negative CTA and plain films.  Per review of pulmonology note, pain was suspected be musculoskeletal in etiology, no identified pulmonary etiology of symptoms.  He states his symptoms recurred today though he felt more short of breath than usual and then had a near syncopal episode which caused him to panic.  He was outside in the heat grilling.  When he returned inside to eat he sat down to eat then began having 1 of these episodes of pain and shortness of breath.  Did have a COVID exposure on Saturday.  He is vaccinated x2 with [employer].  He endorses some cough.  No fevers.  Is not having active shortness of breath on evaluation though does have some pain in his right shoulder. Regarding chronic shoulder pains, he states sx are worse at night time. No particular abdominal pain. He mostly vapes, states he probably vapes more than he should have yesterday while he was drinking.  No history of DVT or PE.  No recent trauma, immobilization, surgery.  No personal history of cancer.  No exogenous estrogen use.  He had COVID in January of this year.  The history is provided by the patient and medical records.       Past Medical History:  Diagnosis Date  . AVN (avascular necrosis of bone) (HCC) 10/2016   Right Hip  . Headache    Migraines- as a child    Patient Active Problem List   Diagnosis Date Noted  . Avascular necrosis of bone of left hip (HCC) 10/31/2019  . Avascular necrosis of hip, right (HCC)  11/11/2016  . Status post total replacement of right hip 11/11/2016  . Smoker 06/05/2015    Past Surgical History:  Procedure Laterality Date  . TOTAL HIP ARTHROPLASTY Right 11/11/2016  . TOTAL HIP ARTHROPLASTY Right 11/11/2016   Procedure: RIGHT TOTAL HIP ARTHROPLASTY ANTERIOR APPROACH;  Surgeon: Kathryne Hitch, MD;  Location: [facility] OR;  Service: Orthopedics;  Laterality: Right;  . TYMPANOSTOMY TUBE PLACEMENT Left   . WISDOM TOOTH EXTRACTION         Family History  Problem Relation Age of Onset  . Hyperlipidemia Mother   . Hypertension Maternal Grandmother     Social History   Tobacco Use  . Smoking status: Current Every Day Smoker    Packs/day: 0.50    Years: 14.00    Pack years: 7.00  . Smokeless tobacco: Never Used  Vaping Use  . Vaping Use: Never used  Substance Use Topics  . Alcohol use: Yes    Alcohol/week: 2.0 standard drinks    Types: 2 Cans of beer per week  . Drug use: Yes    Types: Marijuana    Comment: Monthy - last time 7    Home Medications Prior to Admission medications   Medication Sig Start Date End Date Taking? Authorizing Provider  buPROPion (WELLBUTRIN XL) 150 MG 24 hr tablet TAKE 1 TABLET(150 MG) BY MOUTH DAILY 02/12/20   Saguier, Ramon Dredge,  PA-C  HYDROcodone-acetaminophen (NORCO/VICODIN) 5-325 MG tablet Take 1-2 tablets by mouth every 6 (six) hours as needed for moderate pain. 10/31/19   Kathryne Hitch, MD    Allergies    Patient has no known allergies.  Review of Systems   Review of Systems  Respiratory: Positive for cough and shortness of breath.   Musculoskeletal: Positive for back pain.  All other systems reviewed and are negative.   Physical Exam Updated Vital Signs BP 113/80   Pulse 60   Temp 98.4 F (36.9 C) (Oral)   Resp 13   Ht 6' (1.829 m)   Wt 94.1 kg   SpO2 100%   BMI 28.14 kg/m   Physical Exam Vitals and nursing note reviewed.  Constitutional:      Appearance: He is well-developed.  HENT:      Head: Normocephalic and atraumatic.  Eyes:     Conjunctiva/sclera: Conjunctivae normal.  Cardiovascular:     Rate and Rhythm: Normal rate and regular rhythm.  Pulmonary:     Effort: Pulmonary effort is normal. No respiratory distress.     Breath sounds: Normal breath sounds.  Chest:     Chest wall: No tenderness.  Abdominal:     General: Bowel sounds are normal.     Palpations: Abdomen is soft.     Tenderness: There is abdominal tenderness in the right upper quadrant. There is no guarding or rebound. Negative signs include Murphy's sign.  Musculoskeletal:     Comments: No TTP to back  Skin:    General: Skin is warm.  Neurological:     Mental Status: He is alert.  Psychiatric:        Behavior: Behavior normal.     ED Results / Procedures / Treatments   Labs (all labs ordered are listed, but only abnormal results are displayed) Labs Reviewed  COMPREHENSIVE METABOLIC PANEL - Abnormal; Notable for the following components:      Result Value   Glucose, Bld 101 (*)    ALT 49 (*)    All other components within normal limits  SARS CORONAVIRUS 2 (TAT 6-24 HRS)  CBC WITH DIFFERENTIAL/PLATELET  LIPASE, BLOOD  TROPONIN I (HIGH SENSITIVITY)    EKG EKG Interpretation  Date/Time:  Monday Sep 22 2020 18:47:36 EDT Ventricular Rate:  64 PR Interval:  135 QRS Duration: 104 QT Interval:  431 QTC Calculation: 445 R Axis:   8 Text Interpretation: Sinus rhythm Confirmed by Alvester Chou [phone]) on 09/22/2020 6:52:49 PM   Radiology DG Chest Port 1 View  Result Date: 09/22/2020 CLINICAL DATA:  Shortness of breath. COVID exposure. EXAM: PORTABLE CHEST 1 VIEW COMPARISON:  March 12, 2019 FINDINGS: The heart size and mediastinal contours are within normal limits. Both lungs are clear. The visualized skeletal structures are unremarkable. IMPRESSION: No active disease. Electronically Signed   By: Ted Mcalpine M.D.   On: 09/22/2020 18:17   US Abdomen Limited RUQ  (LIVER/GB)  Result Date: 09/22/2020 CLINICAL DATA:  35 year old male with right upper quadrant abdominal pain. EXAM: ULTRASOUND ABDOMEN LIMITED RIGHT UPPER QUADRANT COMPARISON:  None. FINDINGS: Gallbladder: No gallstones or wall thickening visualized. No sonographic Murphy sign noted by sonographer. Common bile duct: Diameter: 4 mm Liver: There is diffuse increased liver echogenicity most commonly seen in the setting of fatty infiltration. Superimposed inflammation or fibrosis is not excluded. Clinical correlation is recommended. Portal vein is patent on color Doppler imaging with normal direction of blood flow towards the liver. Other: None. IMPRESSION: Fatty liver, otherwise  unremarkable right upper quadrant ultrasound. Electronically Signed   By: Elgie Collard M.D.   On: 09/22/2020 20:00    Procedures Procedures   Medications Ordered in ED Medications - No data to display  ED Course  I have reviewed the triage vital signs and the nursing notes.  Pertinent labs & imaging results that were available during my care of the patient were reviewed by me and considered in my medical decision making (see chart for details).    MDM Rules/Calculators/A&P                          Patient is presenting for evaluation of right-sided back pain with shortness of breath.  This has been episodic in nature and chronic and worked up by multiple specialties.  Symptoms settle a bit worse today.  He did have COVID exposure over the weekend and has had some slight cough.  On exam he is well-appearing and in no distress.  He states the pain in his right shoulder does have been more so at night and was postprandial today.  Considered gallbladder pathology, does have some discomfort in the right upper quadrant on exam without Murphy sign.  No risk factors for PE, had negative CTA by pulmonology outpatient for the symptoms.    Blood work is overall reassuring, no leukocytosis or significant electrolyte abnormality.   Lipase within normal limits.  Troponin is negative, and was collected many hours after the episode of symptoms.  Chest x-ray is clear.  Right upper quadrant ultrasound shows normal gallbladder, does show fatty liver.  COVID swab is sent.  He is oxygenating excellently on room air, is not short of breath here.  Recommend outpatient follow-up for his chronic recurring symptoms.  He is aware he has COVID test pending.  Discussed home isolation per current CDC guidelines.  Symptomatic management as needed.  Return if worsening.  Discussed results, findings, treatment and follow up. Patient advised of return precautions. Patient verbalized understanding and agreed with plan.  Devin Romero was evaluated in Emergency Department on 09/22/2020 for the symptoms described in the history of present illness. He was evaluated in the context of the global COVID-19 pandemic, which necessitated consideration that the patient might be at risk for infection with the SARS-CoV-2 virus that causes COVID-19. Institutional protocols and algorithms that pertain to the evaluation of patients at risk for COVID-19 are in a state of rapid change based on information released by regulatory bodies including the CDC and federal and state organizations. These policies and algorithms were followed during the patient's care in the ED.  Final Clinical Impression(s) / ED Diagnoses Final diagnoses:  RUQ abdominal tenderness  Exposure to COVID-19 virus  Chronic right-sided thoracic back pain  Shortness of breath    Rx / DC Orders ED Discharge Orders    None       Amin Fornwalt, [place] N, PA-C 09/22/20 2041    Terald Sleeper, MD 09/23/20 [phone]

## 2020-09-22 NOTE — ED Notes (Signed)
[place] at bedside of Pt.

## 2020-09-22 NOTE — ED Triage Notes (Signed)
Covid exposure x 3 days ago. Sob this am.

## 2020-09-23 LAB — SARS CORONAVIRUS 2 (TAT 6-24 HRS): SARS Coronavirus 2: NEGATIVE

## 2021-01-05 ENCOUNTER — Emergency Department (HOSPITAL_BASED_OUTPATIENT_CLINIC_OR_DEPARTMENT_OTHER)
Admission: EM | Admit: 2021-01-05 | Discharge: 2021-01-05 | Disposition: A | Discharge status: Home / Self Care | Payer: BC Managed Care – PPO | Attending: Emergency Medicine | Admitting: Emergency Medicine

## 2021-01-05 ENCOUNTER — Other Ambulatory Visit: Payer: Self-pay

## 2021-01-05 ENCOUNTER — Emergency Department (HOSPITAL_BASED_OUTPATIENT_CLINIC_OR_DEPARTMENT_OTHER): Payer: BC Managed Care – PPO

## 2021-01-05 ENCOUNTER — Encounter (HOSPITAL_BASED_OUTPATIENT_CLINIC_OR_DEPARTMENT_OTHER): Payer: Self-pay

## 2021-01-05 DIAGNOSIS — F17210 Nicotine dependence, cigarettes, uncomplicated: Secondary | ICD-10-CM | POA: Insufficient documentation

## 2021-01-05 DIAGNOSIS — M65271 Calcific tendinitis, right ankle and foot: Secondary | ICD-10-CM | POA: Diagnosis not present

## 2021-01-05 DIAGNOSIS — Z96641 Presence of right artificial hip joint: Secondary | ICD-10-CM | POA: Diagnosis not present

## 2021-01-05 DIAGNOSIS — M7751 Other enthesopathy of right foot: Secondary | ICD-10-CM

## 2021-01-05 DIAGNOSIS — M79671 Pain in right foot: Secondary | ICD-10-CM | POA: Diagnosis present

## 2021-01-05 MED ORDER — KETOROLAC TROMETHAMINE 30 MG/ML IJ SOLN
30.0000 mg | Freq: Once | INTRAMUSCULAR | Status: AC
  Administered 2021-01-05: 30 mg via INTRAMUSCULAR
  Filled 2021-01-05: qty 1

## 2021-01-05 NOTE — Discharge Instructions (Addendum)
Watch for signs of developing infection in your foot, to include redness, worsening swelling, fever/chills.

## 2021-01-05 NOTE — ED Triage Notes (Signed)
Patient reports pain to right foot, denies injury.  Pain started last night, ice made pain worse, warmth improved the pain.  Patient took tylenol last night without improvement.  Patient reports he was more active yesterday than normal (doing yard work, washing car, etc).

## 2021-01-05 NOTE — ED Provider Notes (Signed)
[facility] EM[facility]ENCY [facility]MENT Provider Note   CSN: [id number] Arrival date & time: 01/05/21  7001     History Chief Complaint  Patient presents with   Foot Pain    Devin Romero is a 35 y.o. male.   Foot Pain Pertinent negatives include no chest pain, no abdominal pain and no shortness of breath.   Patient presents to the emergency department with roughly 1 day of foot pain.  He states that he was active more than usual yesterday doing yard work.  He endorses pain in his right foot, specifically along his extensor tendon of his first digit along the top of his foot.  No toe swelling or tenderness of his toe.  No fevers or chills.  No redness.  He states that it is more difficult to bear weight on the foot due to the pain.  He took Tylenol last night without improvement.  The pain is sharp and achy and worse with weightbearing and movement.  Past Medical History:  Diagnosis Date   AVN (avascular necrosis of bone) (HCC) 10/2016   Right Hip   Headache    Migraines- as a child    Patient Active Problem List   Diagnosis Date Noted   Avascular necrosis of bone of left hip (HCC) 10/31/2019   Avascular necrosis of hip, right (HCC) 11/11/2016   Status post total replacement of right hip 11/11/2016   Smoker 06/05/2015    Past Surgical History:  Procedure Laterality Date   TOTAL HIP ARTHROPLASTY Right 11/11/2016   TOTAL HIP ARTHROPLASTY Right 11/11/2016   Procedure: RIGHT TOTAL HIP ARTHROPLASTY ANTERIOR APPROACH;  Surgeon: Kathryne Hitch, MD;  Location: [facility] OR;  Service: Orthopedics;  Laterality: Right;   TYMPANOSTOMY TUBE PLACEMENT Left    WISDOM TOOTH EXTRACTION         Family History  Problem Relation Age of Onset   Hyperlipidemia Mother    Hypertension Maternal Grandmother     Social History   Tobacco Use   Smoking status: Every Day    Packs/day: 0.50    Years: 14.00    Pack years: 7.00    Types: Cigarettes   Smokeless tobacco: Never   Vaping Use   Vaping Use: Never used  Substance Use Topics   Alcohol use: Yes    Alcohol/week: 2.0 standard drinks    Types: 2 Cans of beer per week   Drug use: Yes    Types: Marijuana    Comment: Monthy - last time 7    Home Medications Prior to Admission medications   Medication Sig Start Date End Date Taking? Authorizing Provider  buPROPion (WELLBUTRIN XL) 150 MG 24 hr tablet TAKE 1 TABLET(150 MG) BY MOUTH DAILY 02/12/20   Saguier, Ramon Dredge, PA-C  HYDROcodone-acetaminophen (NORCO/VICODIN) 5-325 MG tablet Take 1-2 tablets by mouth every 6 (six) hours as needed for moderate pain. 10/31/19   Kathryne Hitch, MD    Allergies    Patient has no known allergies.  Review of Systems   Review of Systems  Constitutional:  Negative for chills and fever.  HENT:  Negative for ear pain and sore throat.   Eyes:  Negative for pain and visual disturbance.  Respiratory:  Negative for cough and shortness of breath.   Cardiovascular:  Negative for chest pain and palpitations.  Gastrointestinal:  Negative for abdominal pain and vomiting.  Genitourinary:  Negative for dysuria and hematuria.  Musculoskeletal:  Positive for arthralgias. Negative for back pain.  Skin:  Negative for color  change and rash.  Neurological:  Negative for seizures and syncope.  All other systems reviewed and are negative.  Physical Exam Updated Vital Signs BP (!) 120/93 (BP Location: Right Arm)   Pulse 95   Temp 98.6 F (37 C) (Oral)   Resp 18   Ht 6' (1.829 m)   Wt 97.5 kg   SpO2 98%   BMI 29.16 kg/m   Physical Exam Vitals and nursing note reviewed.  Constitutional:      Appearance: He is well-developed.  HENT:     Head: Normocephalic and atraumatic.  Eyes:     Conjunctiva/sclera: Conjunctivae normal.  Cardiovascular:     Rate and Rhythm: Normal rate and regular rhythm.     Heart sounds: No murmur heard. Pulmonary:     Effort: Pulmonary effort is normal. No respiratory distress.     Breath  sounds: Normal breath sounds.  Abdominal:     Palpations: Abdomen is soft.     Tenderness: There is no abdominal tenderness.  Musculoskeletal:     Cervical back: Neck supple.     Comments: No erythema or warmth of the right foot noted.  Tenderness palpation about the extensor tendon of the first digit of the right foot.  2+ DP pulses.  No swelling noted.  No tenderness of the great toe.  Intact range of motion.  Skin:    General: Skin is warm and dry.  Neurological:     Mental Status: He is alert.    ED Results / Procedures / Treatments   Labs (all labs ordered are listed, but only abnormal results are displayed) Labs Reviewed - No data to display  EKG None  Radiology DG Foot Complete Right  Result Date: 01/05/2021 CLINICAL DATA:  35 year old male with foot pain.  No known injury. EXAM: RIGHT FOOT COMPLETE - 3+ VIEW COMPARISON:  None. FINDINGS: Bone mineralization is within normal limits. There is no evidence of fracture or dislocation. There is no evidence of arthropathy or other focal bone abnormality. Soft tissues are unremarkable. IMPRESSION: Negative. Electronically Signed   By: Odessa Fleming M.D.   On: 01/05/2021 09:52    Procedures Procedures   Medications Ordered in ED Medications  ketorolac (TORADOL) 30 MG/ML injection 30 mg (30 mg Intramuscular Given 01/05/21 0932)    ED Course  I have reviewed the triage vital signs and the nursing notes.  Pertinent labs & imaging results that were available during my care of the patient were reviewed by me and considered in my medical decision making (see chart for details).    MDM Rules/Calculators/A&P                           35 year old male presenting to the emergency [facility] Brazen with roughly 1 day of foot pain.  He has tenderness to palpation of the extensor tendon of the extensor hallucis longus.  He has no erythema or warmth to suggest cellulitis.  No swelling noted.  Neurovascularly intact.  X-ray imaging was obtained which  revealed no evidence of fracture.  Toradol provided for pain control which improved the patient's pain to the point where he was able to bear weight and ambulate.  No evidence for acute gout flare or infectious etiology.  Suspect likely tendinitis of the extensor hallucis longus.  Patient was advised rest, ice and heat as needed, NSAIDs for pain control, weightbearing as tolerated.  Precautions provided in the event of developing signs of infection.  Final Clinical  Impression(s) / ED Diagnoses Final diagnoses:  Tendinitis of right foot    Rx / DC Orders ED Discharge Orders     None        Ernie Avena, MD 01/05/21 2020

## 2021-07-09 IMAGING — DX DG CHEST 1V PORT
1 series · 2 of 2 positions shown · non-contrast
Comparison: March 12, 2019

CLINICAL DATA: Shortness of breath.

COVID exposure.
EXAM:
PORTABLE CHEST 1 VIEW

[Series 1: chest ap · 0.14mm/px · 2 of 2 slices shown]
[im 1/2]
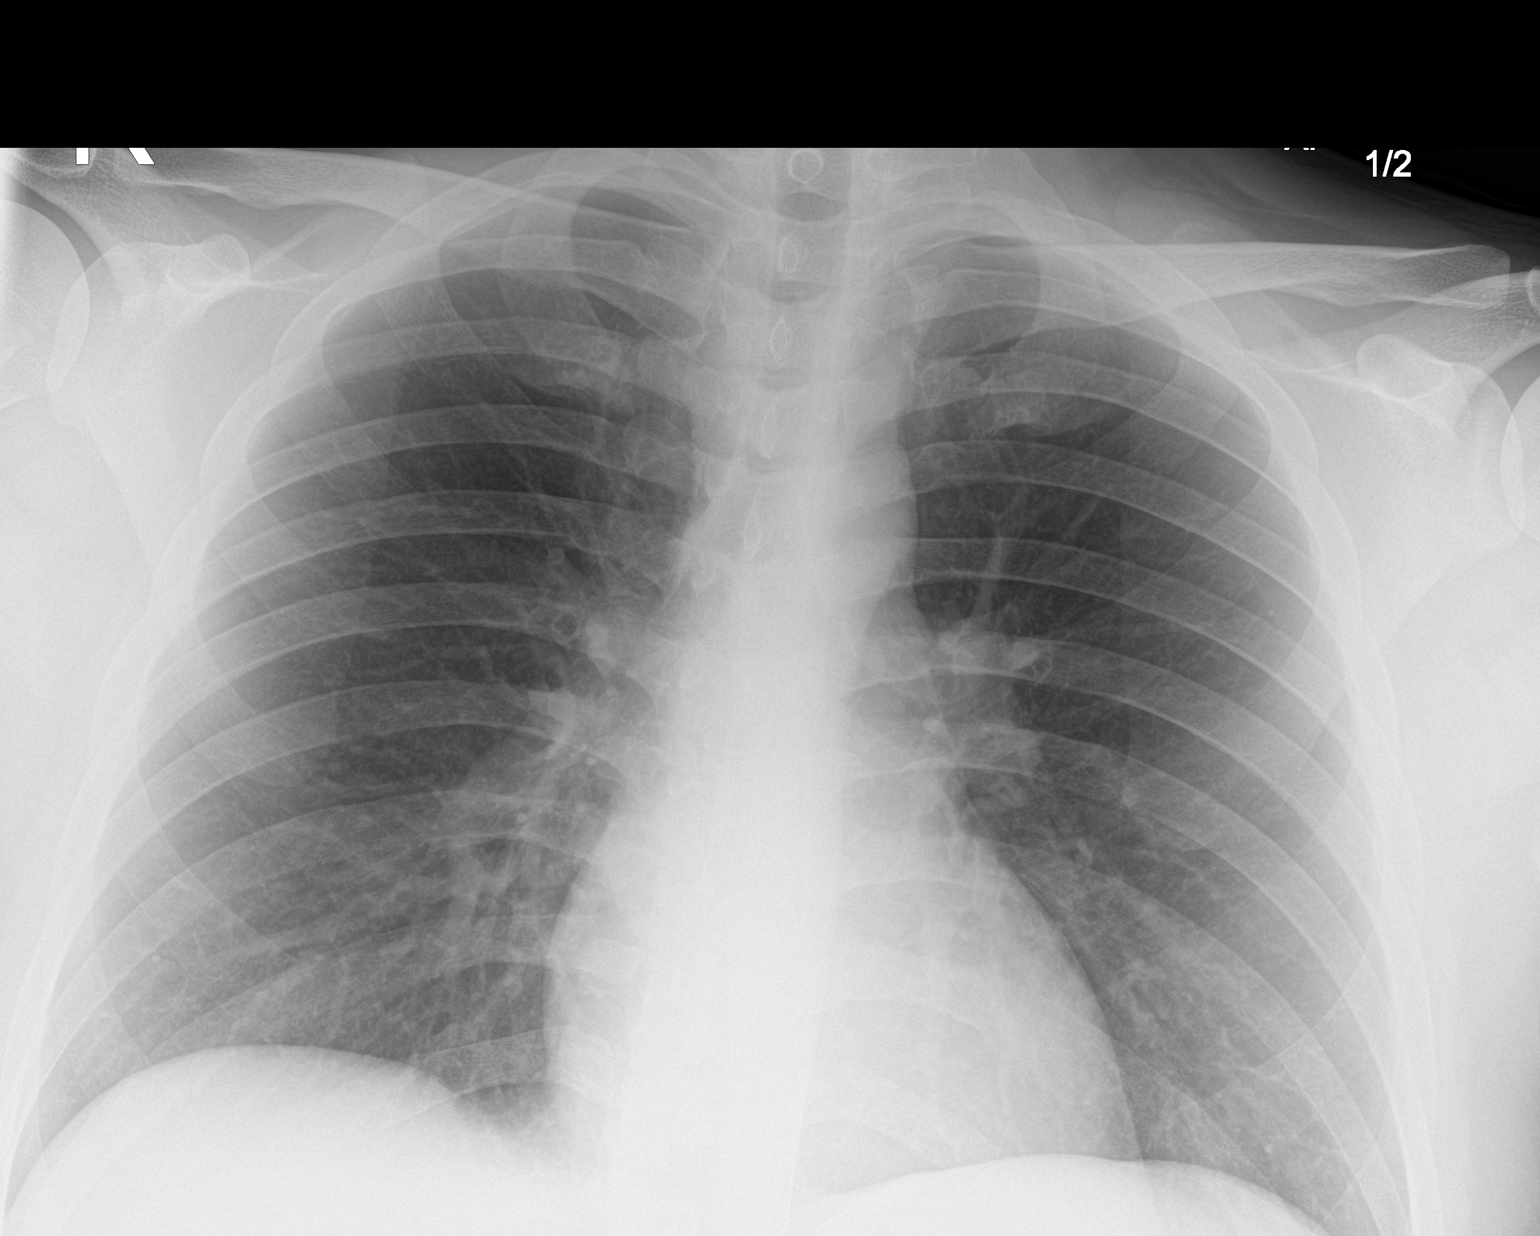
[im 2/2]
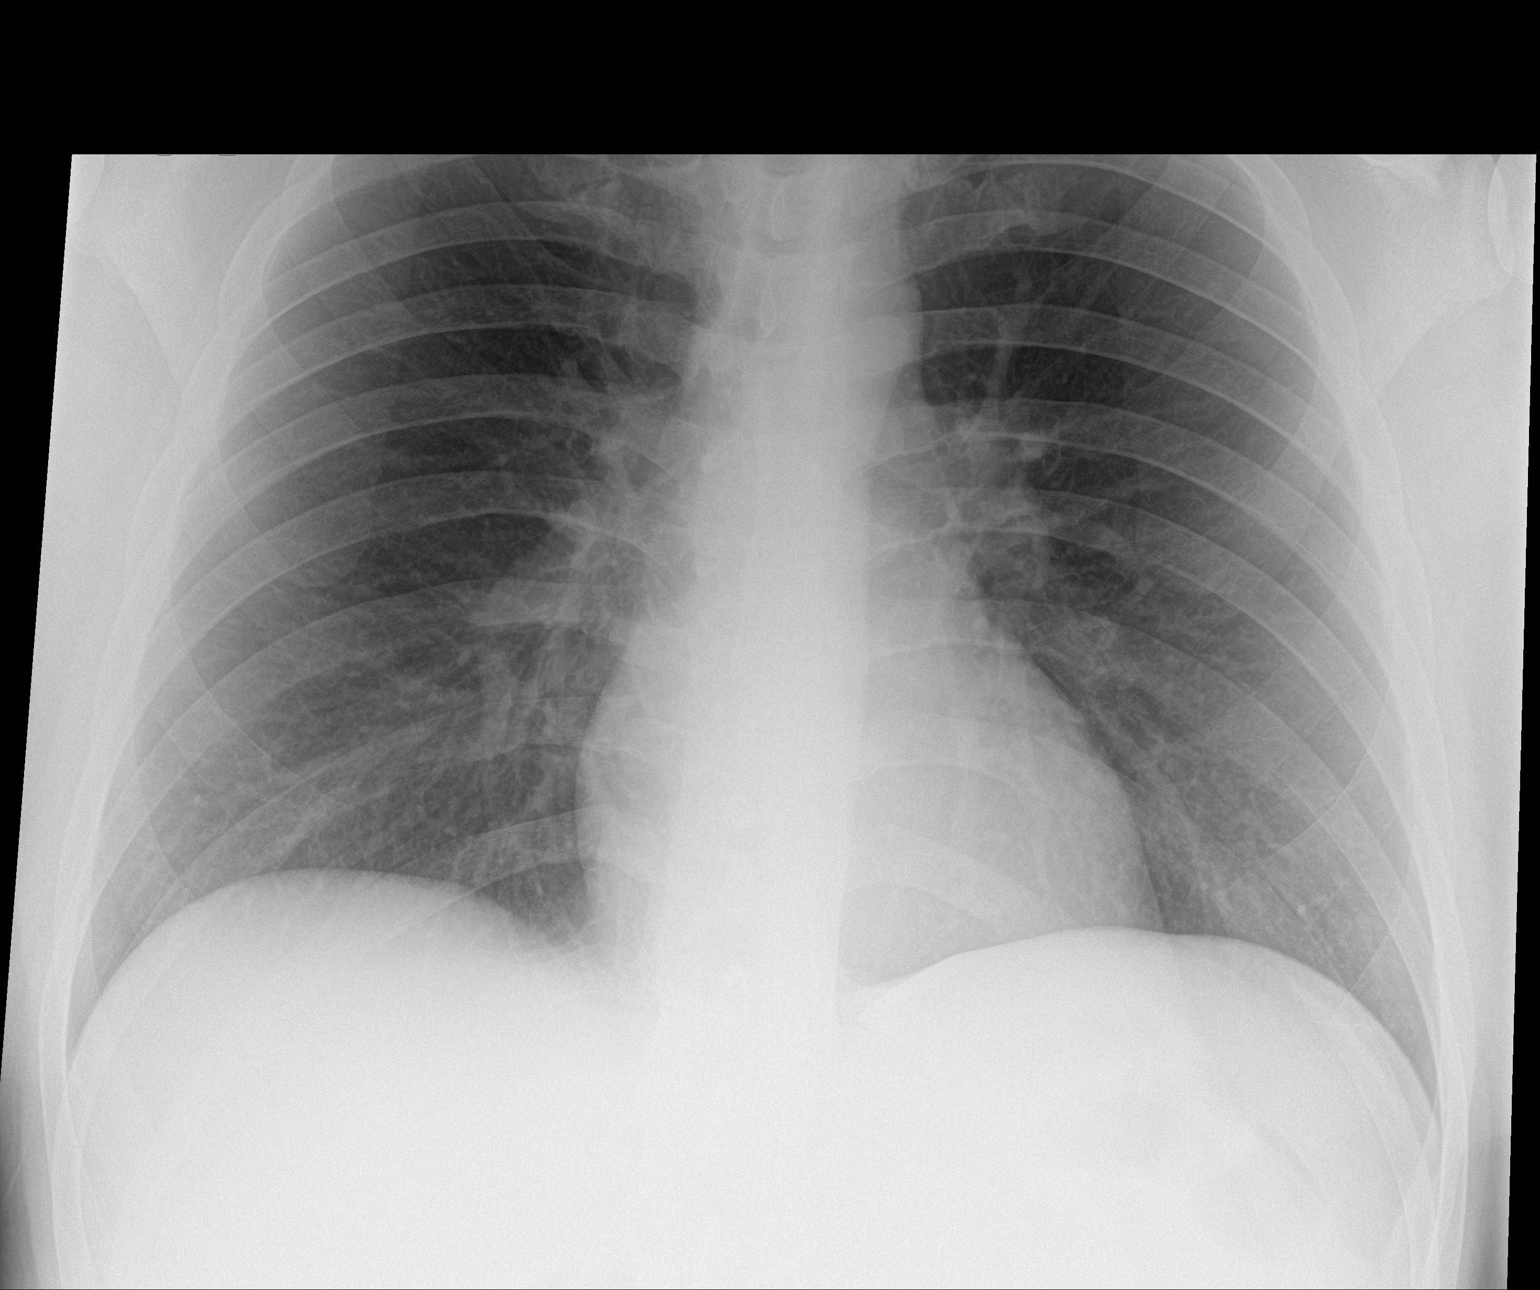

[2 of 2 positions shown; findings below may reference images not displayed]

FINDINGS: The heart size and mediastinal contours are within normal limits.
Both lungs are clear. The visualized skeletal structures are
unremarkable.
IMPRESSION: No active disease.

## 2021-07-09 IMAGING — US US ABDOMEN LIMITED
1 series · 14 of 25 positions shown · non-contrast
Comparison: None.

CLINICAL DATA: 34-year-old male with right upper quadrant abdominal
pain.

EXAM:
ULTRASOUND ABDOMEN LIMITED RIGHT UPPER QUADRANT

[Series 1: us abdomen limited · 14 of 49 slices shown]
[im 1/49]
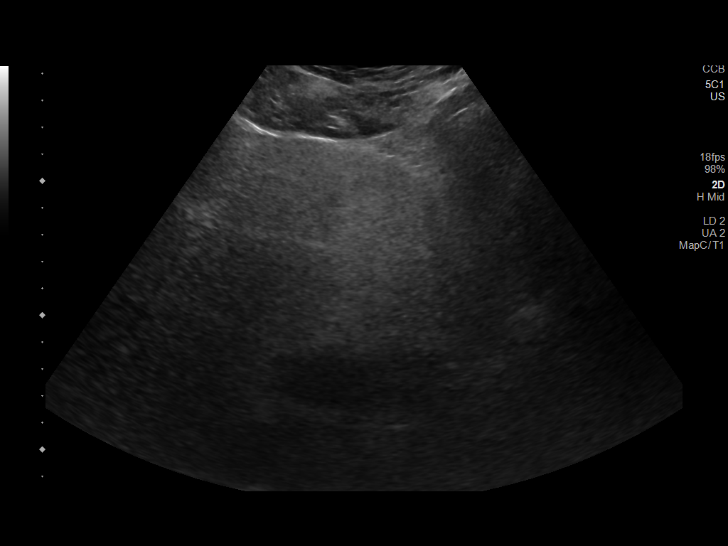
[im 5/49]
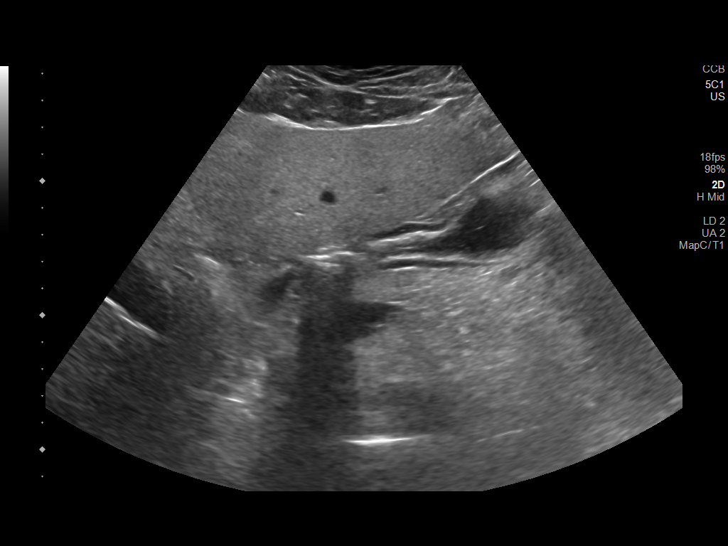
[im 9/49]
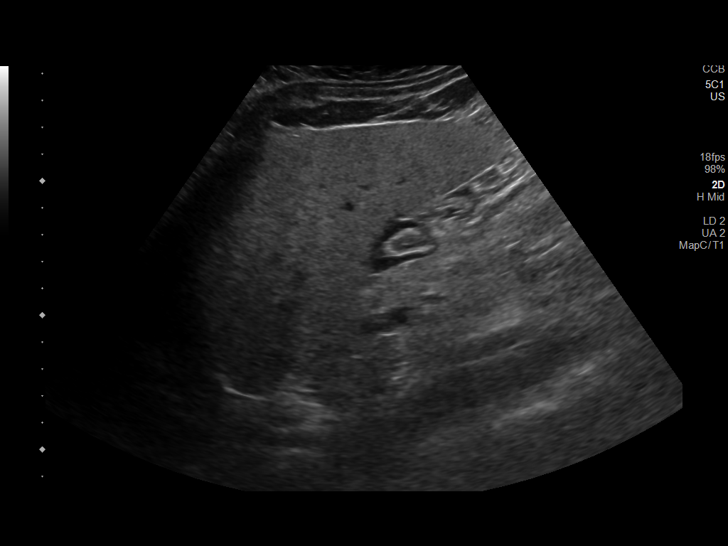
[im 13/49]
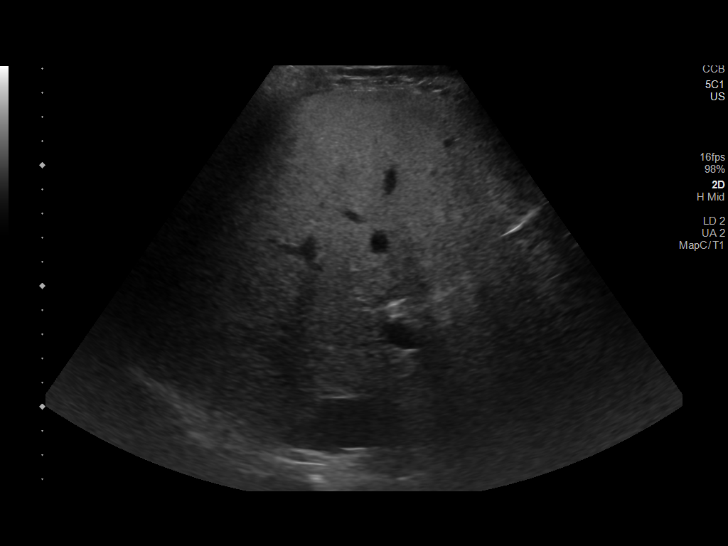
[im 17/49]
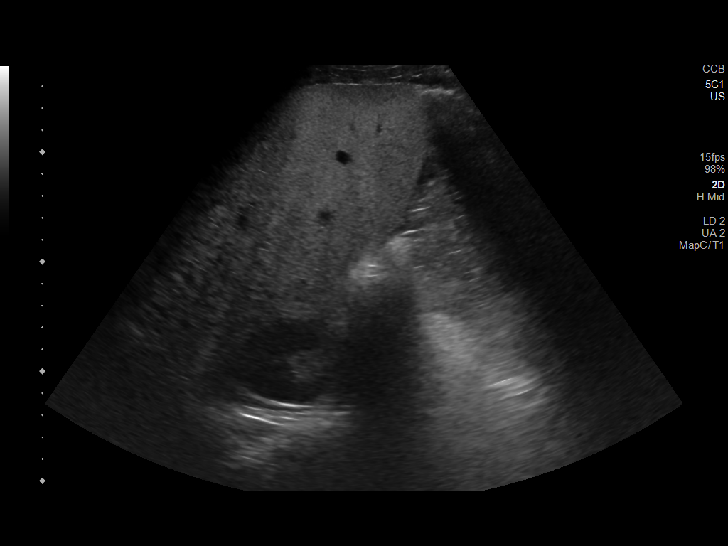
[im 19/49]
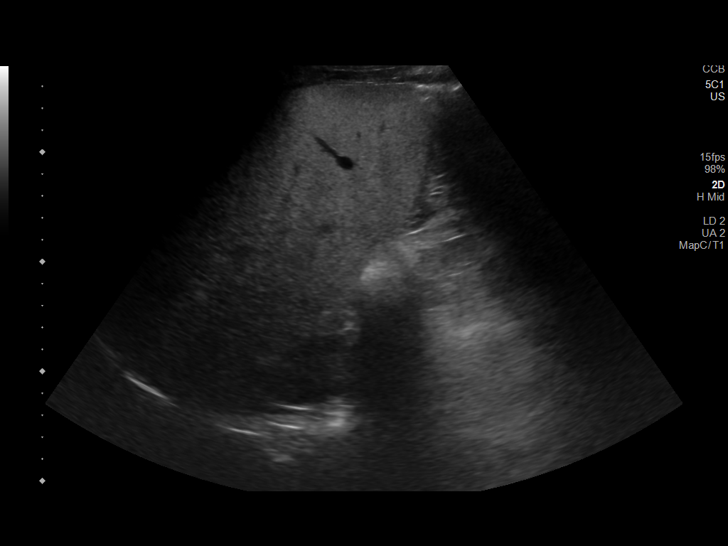
[im 23/49]
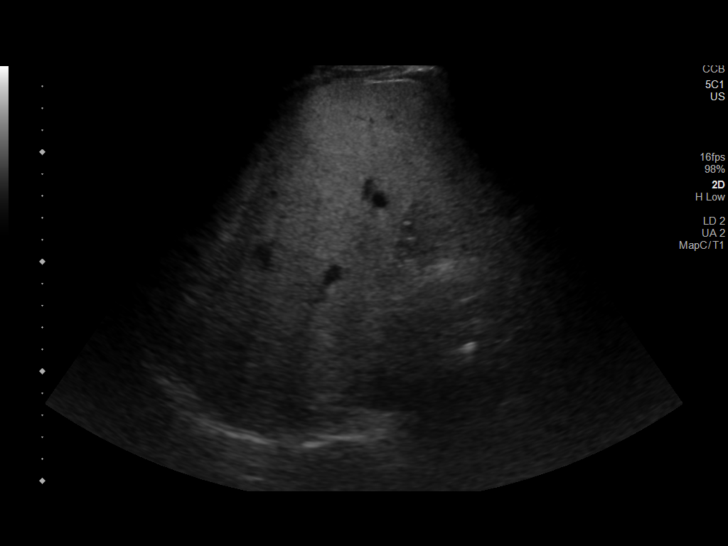
[im 27/49]
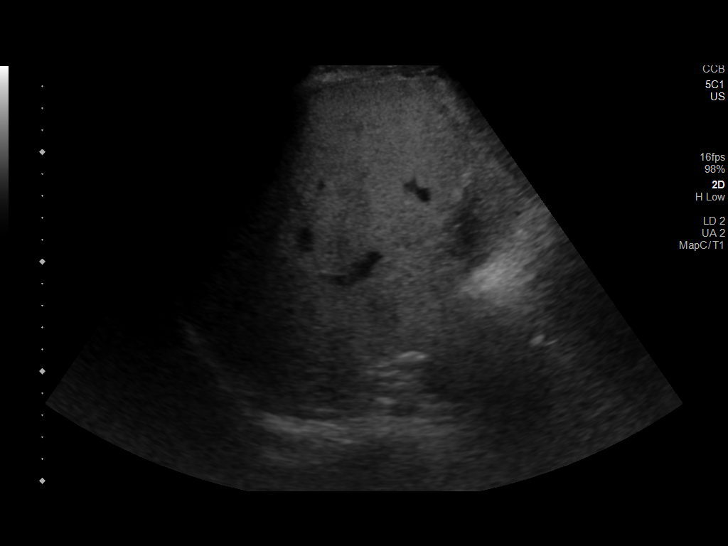
[im 31/49]
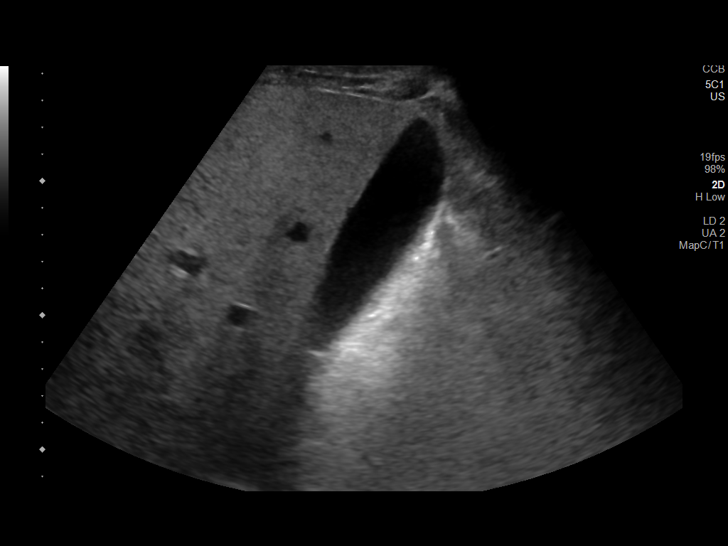
[im 33/49]
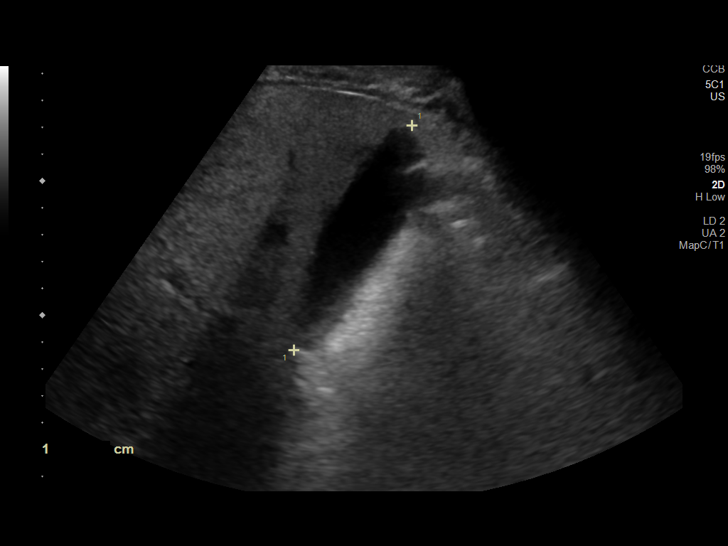
[im 37/49]
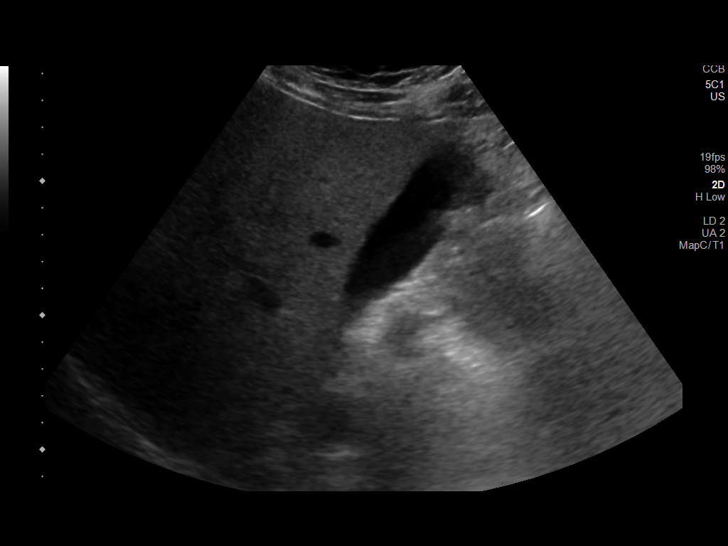
[im 41/49]
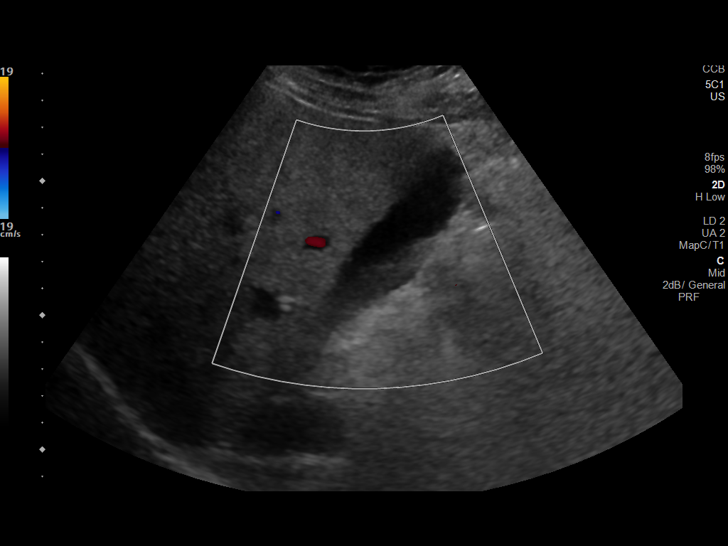
[im 45/49]
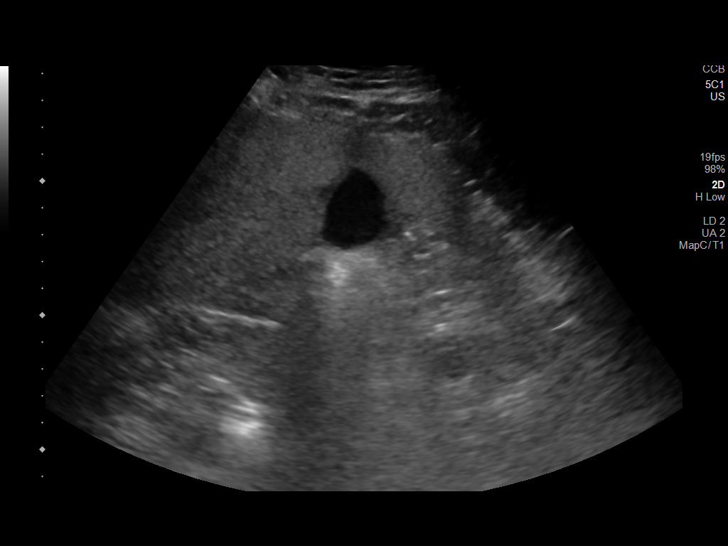
[im 49/49]
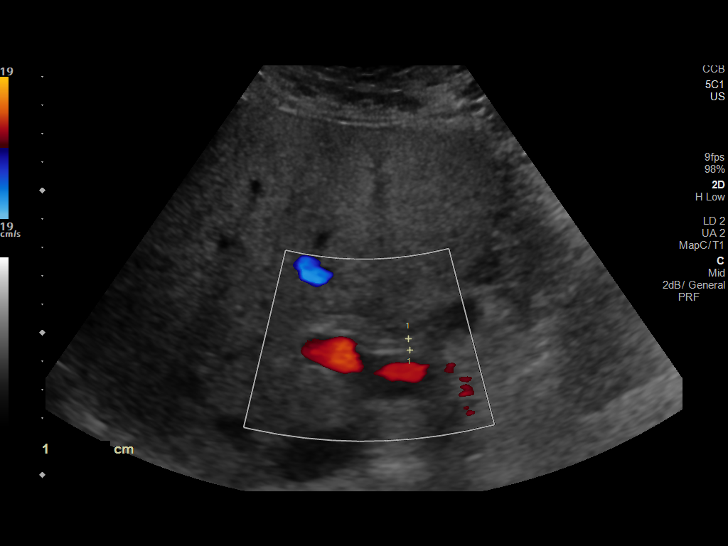

[14 of 25 positions shown; findings below may reference images not displayed]

FINDINGS: Gallbladder:

No gallstones or wall thickening visualized. No sonographic Murphy
sign noted by sonographer.

Common bile duct:

Diameter: 4 mm

Liver:

There is diffuse increased liver echogenicity most commonly seen in
the setting of fatty infiltration. Superimposed inflammation or
fibrosis is not excluded. Clinical correlation is recommended.
Portal vein is patent on color Doppler imaging with normal direction
of blood flow towards the liver.

Other: None.
IMPRESSION: Fatty liver, otherwise unremarkable right upper quadrant ultrasound.

## 2023-12-05 ENCOUNTER — Other Ambulatory Visit (INDEPENDENT_AMBULATORY_CARE_PROVIDER_SITE_OTHER): Payer: Self-pay

## 2023-12-05 ENCOUNTER — Encounter: Payer: Self-pay | Admitting: Orthopaedic Surgery

## 2023-12-05 ENCOUNTER — Ambulatory Visit: Admitting: Orthopaedic Surgery

## 2023-12-05 DIAGNOSIS — M79671 Pain in right foot: Secondary | ICD-10-CM

## 2023-12-05 DIAGNOSIS — M25552 Pain in left hip: Secondary | ICD-10-CM

## 2023-12-05 NOTE — Progress Notes (Signed)
 The patient is a 38 year old gentleman who we replaced his right hip back in 2018 secondary to avascular necrosis.  That hip is always done well for him but recently his left hip started to bother him especially when he was driving a long trip and getting out of a car.  We did obtain an MRI of his left hip back in 2021 and it showed a very small nidus of AVN that had not changed from when he had an MRI back in 2018 that showed both hips.  That MRI did show a small labral tear.  He has also been dealing with some pain in his right foot and he points to really the fourth ray on the dorsal aspect of his foot as a source of his pain for when he first steps down in the morning out of bed is when it hurts him the most.  This has been going on for a long period of time.  Exam both hips move smoothly and fluidly with no blocks to rotation.  The left hip has just a small amount of pain on the extremes or rotation.  His right foot shows normal contours of the foot.  His foot is well-perfused.  His arch is normal.  I compress his foot and he shows only pain to hard palpation along the fourth ray.  His ankle exam is normal.  X-rays of the right foot including 3 views showed no acute findings and no arthritic changes.  Standing AP pelvis and lateral left hip shows the left hip joint space is still well-maintained and there is no evidence of femoral head collapse and I do not see any gross evidence of AVN like it is seen on his MRI from 2021.  I did recommend a steroid injection along the fourth ray on the dorsal aspect of his right foot which he agreed to and tolerated very well.  I would actually like to send him to my partner Dr. Genelle for an evaluation of his left hip.  I would like to see if my partner would consider evaluating his left hip under ultrasound to see if there is any significant fluid collection and considering even a one-time steroid injection if indicated in that left hip or seeing if he is potentially  candidate for a hip arthroscopy.  So far the small nidus of AVN appears stable.  It still may be worth getting a new MRI of his left hip but I will also like to see what my partner thinks.  The patient does agree with this referral.

## 2024-01-02 ENCOUNTER — Ambulatory Visit (HOSPITAL_BASED_OUTPATIENT_CLINIC_OR_DEPARTMENT_OTHER): Admitting: Orthopaedic Surgery

## 2024-02-27 ENCOUNTER — Encounter: Payer: Self-pay | Admitting: Radiology
